# Patient Record
Sex: Male | Born: 1935 | Race: White | Hispanic: No | Marital: Married | Smoking: Former smoker
Health system: Southern US, Community
[De-identification: ages and names within clinical notes are randomized; demographics above are authoritative.]

## PROBLEM LIST (undated history)

## (undated) DIAGNOSIS — E119 Type 2 diabetes mellitus without complications: Secondary | ICD-10-CM

## (undated) DIAGNOSIS — Z9981 Dependence on supplemental oxygen: Secondary | ICD-10-CM

## (undated) DIAGNOSIS — IMO0001 Reserved for inherently not codable concepts without codable children: Secondary | ICD-10-CM

## (undated) DIAGNOSIS — E039 Hypothyroidism, unspecified: Secondary | ICD-10-CM

## (undated) DIAGNOSIS — Z955 Presence of coronary angioplasty implant and graft: Secondary | ICD-10-CM

## (undated) DIAGNOSIS — E785 Hyperlipidemia, unspecified: Secondary | ICD-10-CM

## (undated) DIAGNOSIS — Z794 Long term (current) use of insulin: Secondary | ICD-10-CM

## (undated) DIAGNOSIS — Z951 Presence of aortocoronary bypass graft: Secondary | ICD-10-CM

## (undated) DIAGNOSIS — I472 Ventricular tachycardia, unspecified: Secondary | ICD-10-CM

## (undated) DIAGNOSIS — I2510 Atherosclerotic heart disease of native coronary artery without angina pectoris: Secondary | ICD-10-CM

## (undated) DIAGNOSIS — I255 Ischemic cardiomyopathy: Secondary | ICD-10-CM

## (undated) DIAGNOSIS — I480 Paroxysmal atrial fibrillation: Secondary | ICD-10-CM

## (undated) DIAGNOSIS — R0602 Shortness of breath: Secondary | ICD-10-CM

## (undated) DIAGNOSIS — I209 Angina pectoris, unspecified: Secondary | ICD-10-CM

## (undated) DIAGNOSIS — Z85118 Personal history of other malignant neoplasm of bronchus and lung: Secondary | ICD-10-CM

## (undated) DIAGNOSIS — I5020 Unspecified systolic (congestive) heart failure: Secondary | ICD-10-CM

## (undated) DIAGNOSIS — F0390 Unspecified dementia without behavioral disturbance: Secondary | ICD-10-CM

## (undated) DIAGNOSIS — I214 Non-ST elevation (NSTEMI) myocardial infarction: Secondary | ICD-10-CM

## (undated) DIAGNOSIS — J8410 Pulmonary fibrosis, unspecified: Secondary | ICD-10-CM

## (undated) DIAGNOSIS — I739 Peripheral vascular disease, unspecified: Secondary | ICD-10-CM

## (undated) HISTORY — DX: Long term (current) use of insulin: Z79.4

## (undated) HISTORY — DX: Non-ST elevation (NSTEMI) myocardial infarction: I21.4

## (undated) HISTORY — DX: Paroxysmal atrial fibrillation: I48.0

## (undated) HISTORY — DX: Presence of coronary angioplasty implant and graft: Z95.5

## (undated) HISTORY — DX: Dependence on supplemental oxygen: Z99.81

## (undated) HISTORY — DX: Personal history of other malignant neoplasm of bronchus and lung: Z85.118

## (undated) HISTORY — DX: Ventricular tachycardia, unspecified: I47.20

## (undated) HISTORY — DX: Presence of aortocoronary bypass graft: Z95.1

## (undated) HISTORY — PX: LOBECTOMY: SHX5089

## (undated) HISTORY — PX: OTHER SURGICAL HISTORY: SHX169

## (undated) HISTORY — DX: Ischemic cardiomyopathy: I25.5

## (undated) HISTORY — DX: Unspecified dementia, unspecified severity, without behavioral disturbance, psychotic disturbance, mood disturbance, and anxiety: F03.90

## (undated) HISTORY — DX: Hyperlipidemia, unspecified: E78.5

## (undated) HISTORY — DX: Reserved for inherently not codable concepts without codable children: IMO0001

## (undated) HISTORY — DX: Ventricular tachycardia: I47.2

## (undated) HISTORY — DX: Unspecified systolic (congestive) heart failure: I50.20

## (undated) HISTORY — DX: Hypothyroidism, unspecified: E03.9

## (undated) HISTORY — DX: Peripheral vascular disease, unspecified: I73.9

## (undated) HISTORY — DX: Pulmonary fibrosis, unspecified: J84.10

## (undated) HISTORY — DX: Atherosclerotic heart disease of native coronary artery without angina pectoris: I25.10

## (undated) HISTORY — DX: Type 2 diabetes mellitus without complications: E11.9

---

## 1994-09-11 HISTORY — PX: CORONARY ARTERY BYPASS GRAFT: SHX141

## 1997-12-31 ENCOUNTER — Other Ambulatory Visit: Admission: RE | Admit: 1997-12-31 | Discharge: 1997-12-31 | Payer: Self-pay | Admitting: Cardiology

## 1998-02-12 ENCOUNTER — Inpatient Hospital Stay (HOSPITAL_COMMUNITY): Admission: AD | Admit: 1998-02-12 | Discharge: 1998-02-19 | Payer: Self-pay | Admitting: Cardiology

## 1998-11-19 ENCOUNTER — Inpatient Hospital Stay (HOSPITAL_COMMUNITY): Admission: RE | Admit: 1998-11-19 | Discharge: 1998-11-29 | Payer: Self-pay | Admitting: Cardiology

## 1998-11-20 ENCOUNTER — Encounter: Payer: Self-pay | Admitting: Cardiology

## 2004-09-11 DIAGNOSIS — Z85118 Personal history of other malignant neoplasm of bronchus and lung: Secondary | ICD-10-CM

## 2004-09-11 HISTORY — DX: Personal history of other malignant neoplasm of bronchus and lung: Z85.118

## 2005-01-26 ENCOUNTER — Encounter: Admission: RE | Admit: 2005-01-26 | Discharge: 2005-01-26 | Payer: Self-pay | Admitting: Cardiology

## 2005-02-03 ENCOUNTER — Ambulatory Visit (HOSPITAL_COMMUNITY): Admission: RE | Admit: 2005-02-03 | Discharge: 2005-02-03 | Payer: Self-pay | Admitting: Thoracic Surgery

## 2005-02-08 ENCOUNTER — Encounter (INDEPENDENT_AMBULATORY_CARE_PROVIDER_SITE_OTHER): Payer: Self-pay | Admitting: *Deleted

## 2005-02-08 ENCOUNTER — Ambulatory Visit (HOSPITAL_COMMUNITY): Admission: RE | Admit: 2005-02-08 | Discharge: 2005-02-08 | Payer: Self-pay | Admitting: Thoracic Surgery

## 2005-02-09 ENCOUNTER — Ambulatory Visit (HOSPITAL_COMMUNITY): Admission: RE | Admit: 2005-02-09 | Discharge: 2005-02-09 | Payer: Self-pay | Admitting: Thoracic Surgery

## 2005-02-20 ENCOUNTER — Encounter (INDEPENDENT_AMBULATORY_CARE_PROVIDER_SITE_OTHER): Payer: Self-pay | Admitting: *Deleted

## 2005-02-20 ENCOUNTER — Inpatient Hospital Stay (HOSPITAL_COMMUNITY): Admission: RE | Admit: 2005-02-20 | Discharge: 2005-02-28 | Payer: Self-pay | Admitting: Cardiothoracic Surgery

## 2005-02-22 ENCOUNTER — Ambulatory Visit: Payer: Self-pay | Admitting: Pulmonary Disease

## 2005-03-16 ENCOUNTER — Encounter: Admission: RE | Admit: 2005-03-16 | Discharge: 2005-03-16 | Payer: Self-pay | Admitting: Thoracic Surgery

## 2005-06-22 ENCOUNTER — Encounter: Admission: RE | Admit: 2005-06-22 | Discharge: 2005-06-22 | Payer: Self-pay | Admitting: Cardiothoracic Surgery

## 2005-09-21 ENCOUNTER — Encounter: Admission: RE | Admit: 2005-09-21 | Discharge: 2005-09-21 | Payer: Self-pay | Admitting: Cardiothoracic Surgery

## 2006-01-04 ENCOUNTER — Encounter: Admission: RE | Admit: 2006-01-04 | Discharge: 2006-01-04 | Payer: Self-pay | Admitting: Cardiothoracic Surgery

## 2006-04-16 IMAGING — CR DG CHEST 1V PORT
1 series · 1 of 1 positions shown · non-contrast
Comparison: none

CLINICAL DATA: Follow-up chest tube removal.
 PORTABLE CHEST - 1 VIEW - 02/25/05 AT 2858 HOURS:
 The left chest tube has been removed.  There is a less than 5 percent left pneumothorax.  The central catheter remains at the SVC-right atrial junction.

[view not recorded]
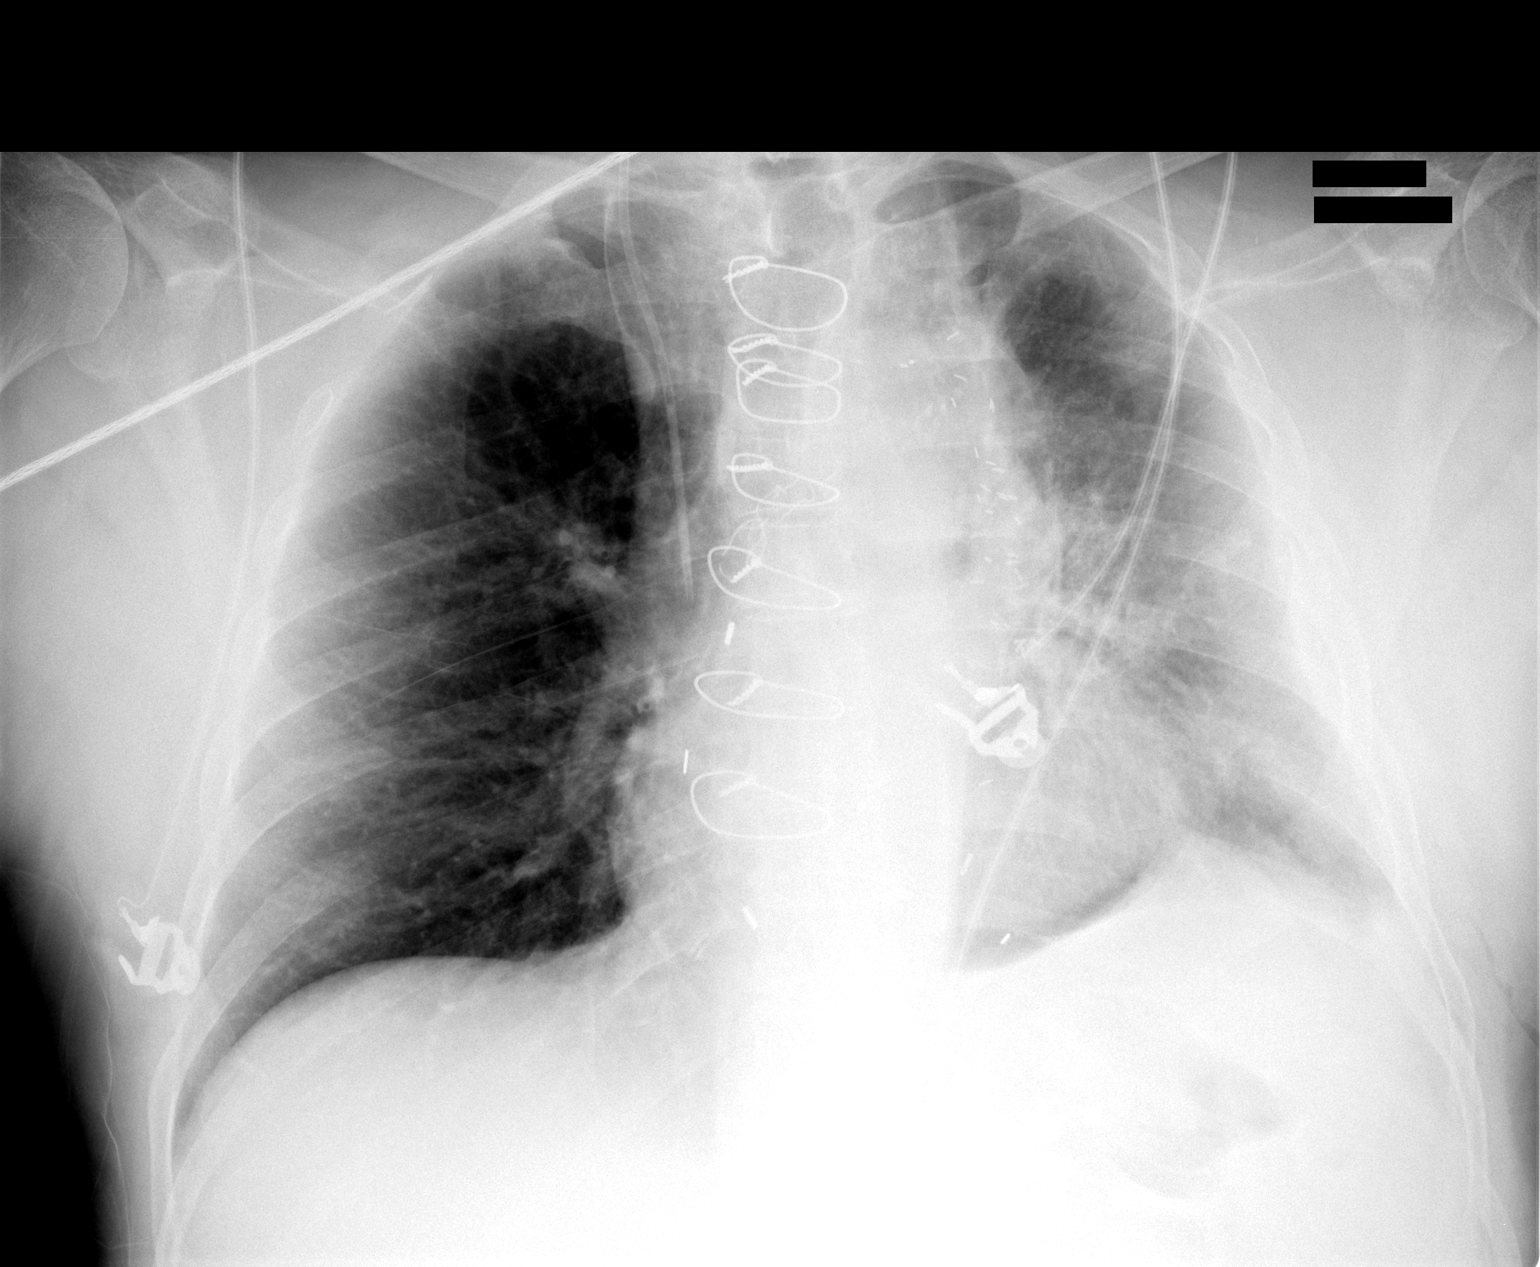

[1 of 1 positions shown; findings below may reference images not displayed]

IMPRESSION: Less than 5 percent pneumothorax status post chest tube removal.

## 2006-04-17 IMAGING — CR DG CHEST 2V
2 series · 2 of 2 positions shown · non-contrast
Comparison: 02/25/05.

CLINICAL DATA: Lung lesion.
 CHEST - 2 VIEW: 
 PA and lateral chest - 02/26/05.

[w chest pa]
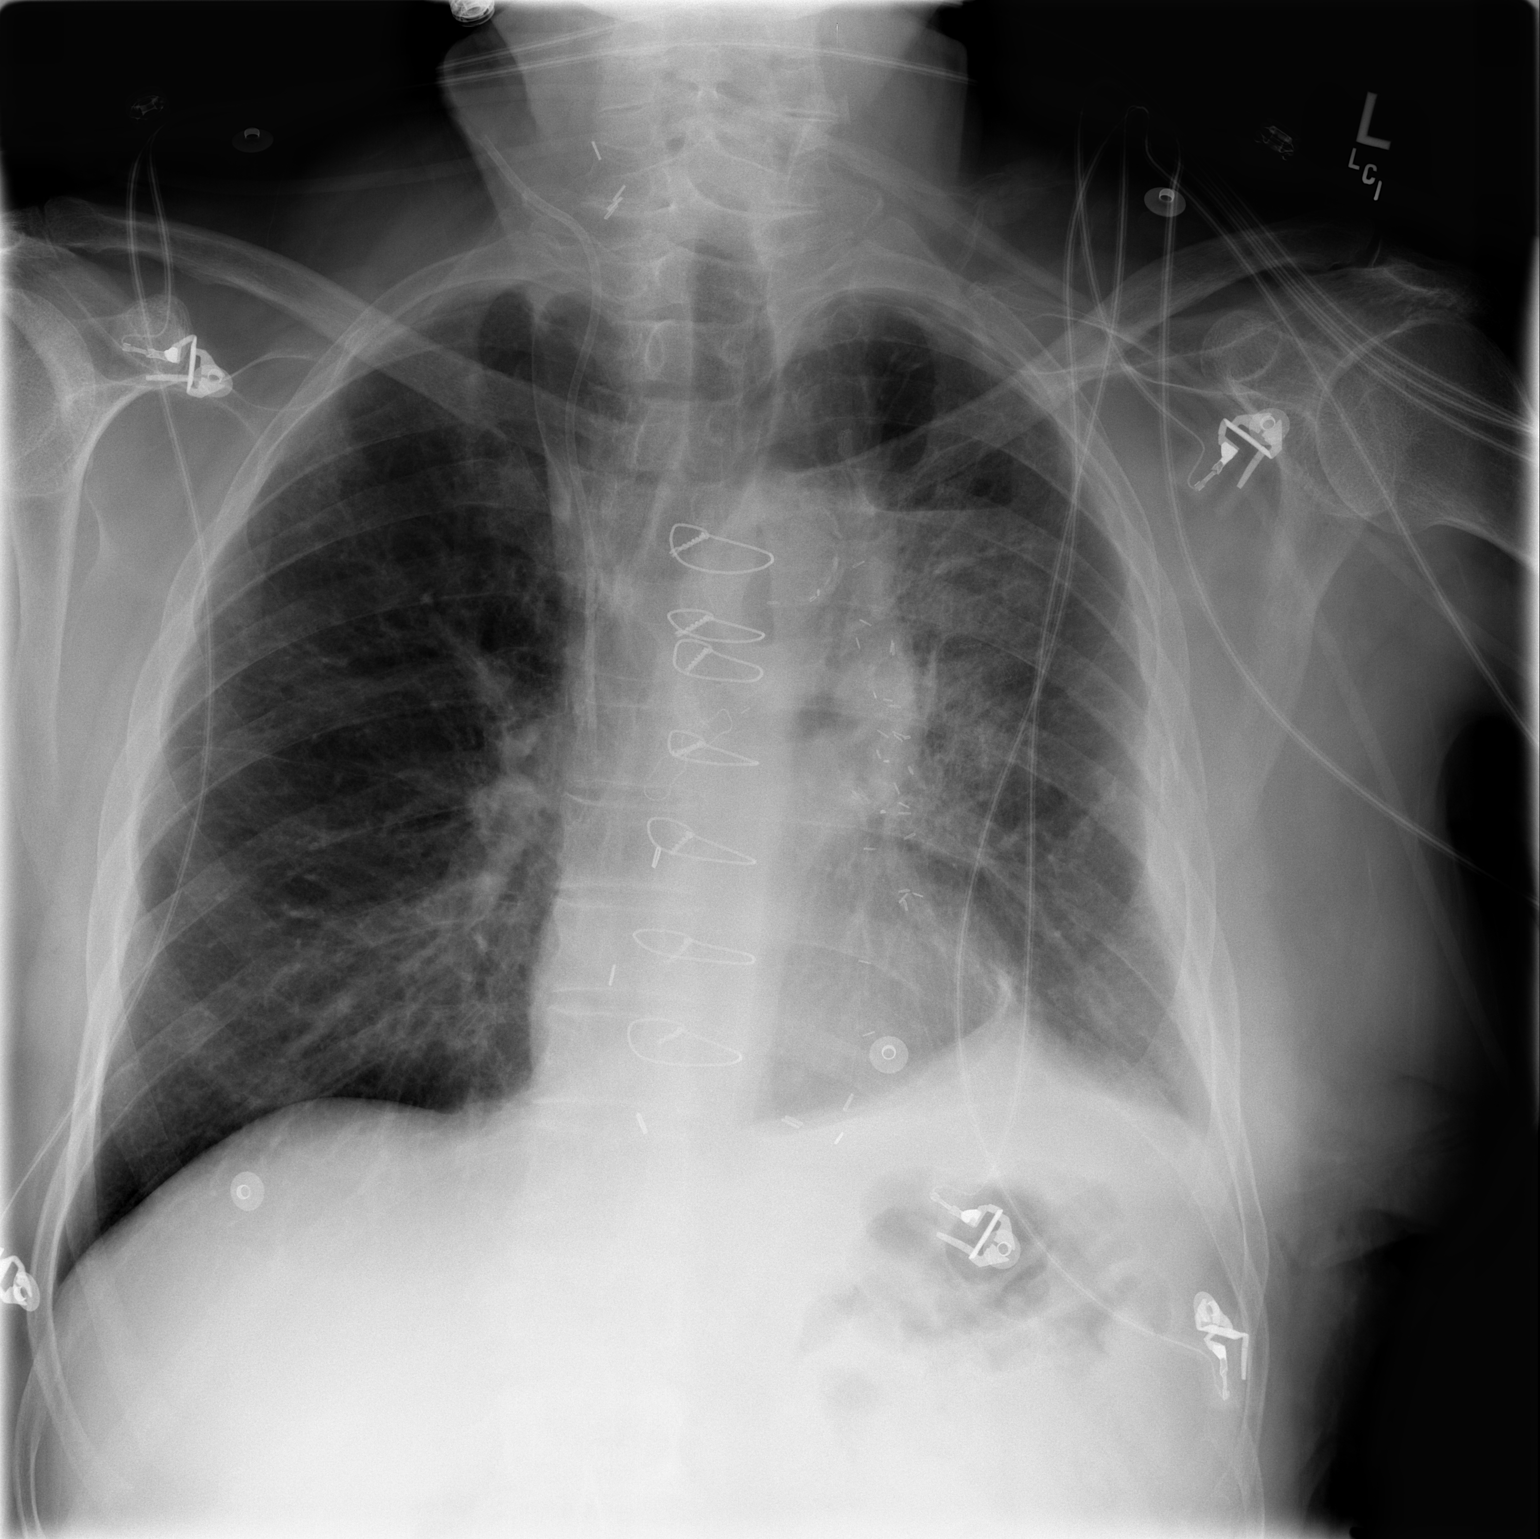

[w chest lat]
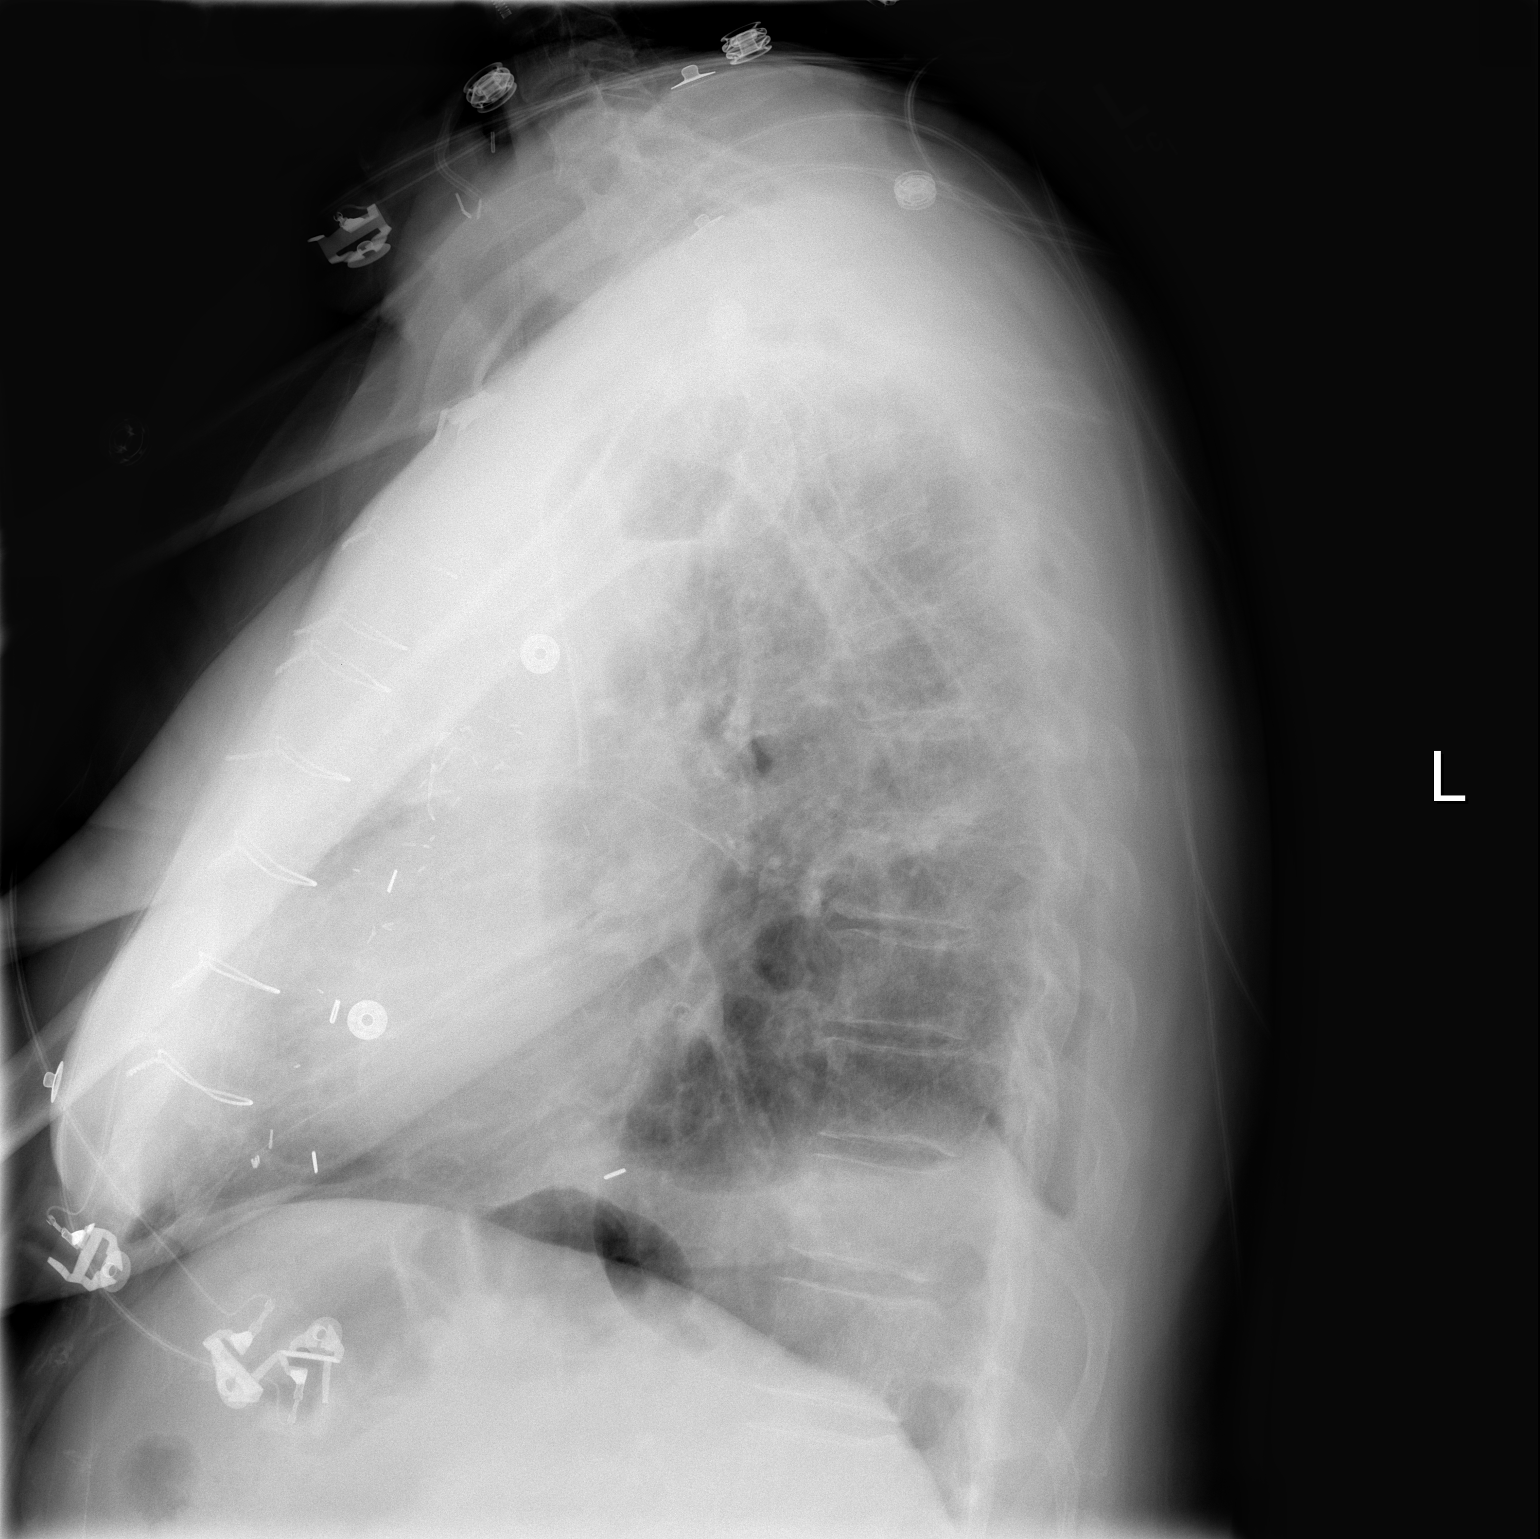

[2 of 2 positions shown; findings below may reference images not displayed]

FINDINGS: The patient has a new, small, left pneumothorax.  There is volume loss in the left chest with tenting of the left hemidiaphragm.  Multiple clips are present in the left hilum.  The right lung remains clear.  A right IJ catheter is in place.
IMPRESSION: Small left pneumothorax without other marked interval change.

## 2006-04-19 IMAGING — CR DG CHEST 2V
2 series · 2 of 2 positions shown · non-contrast
Comparison: none

CLINICAL DATA: Lung lesion status post VATS

[view not recorded (1 of 2)]
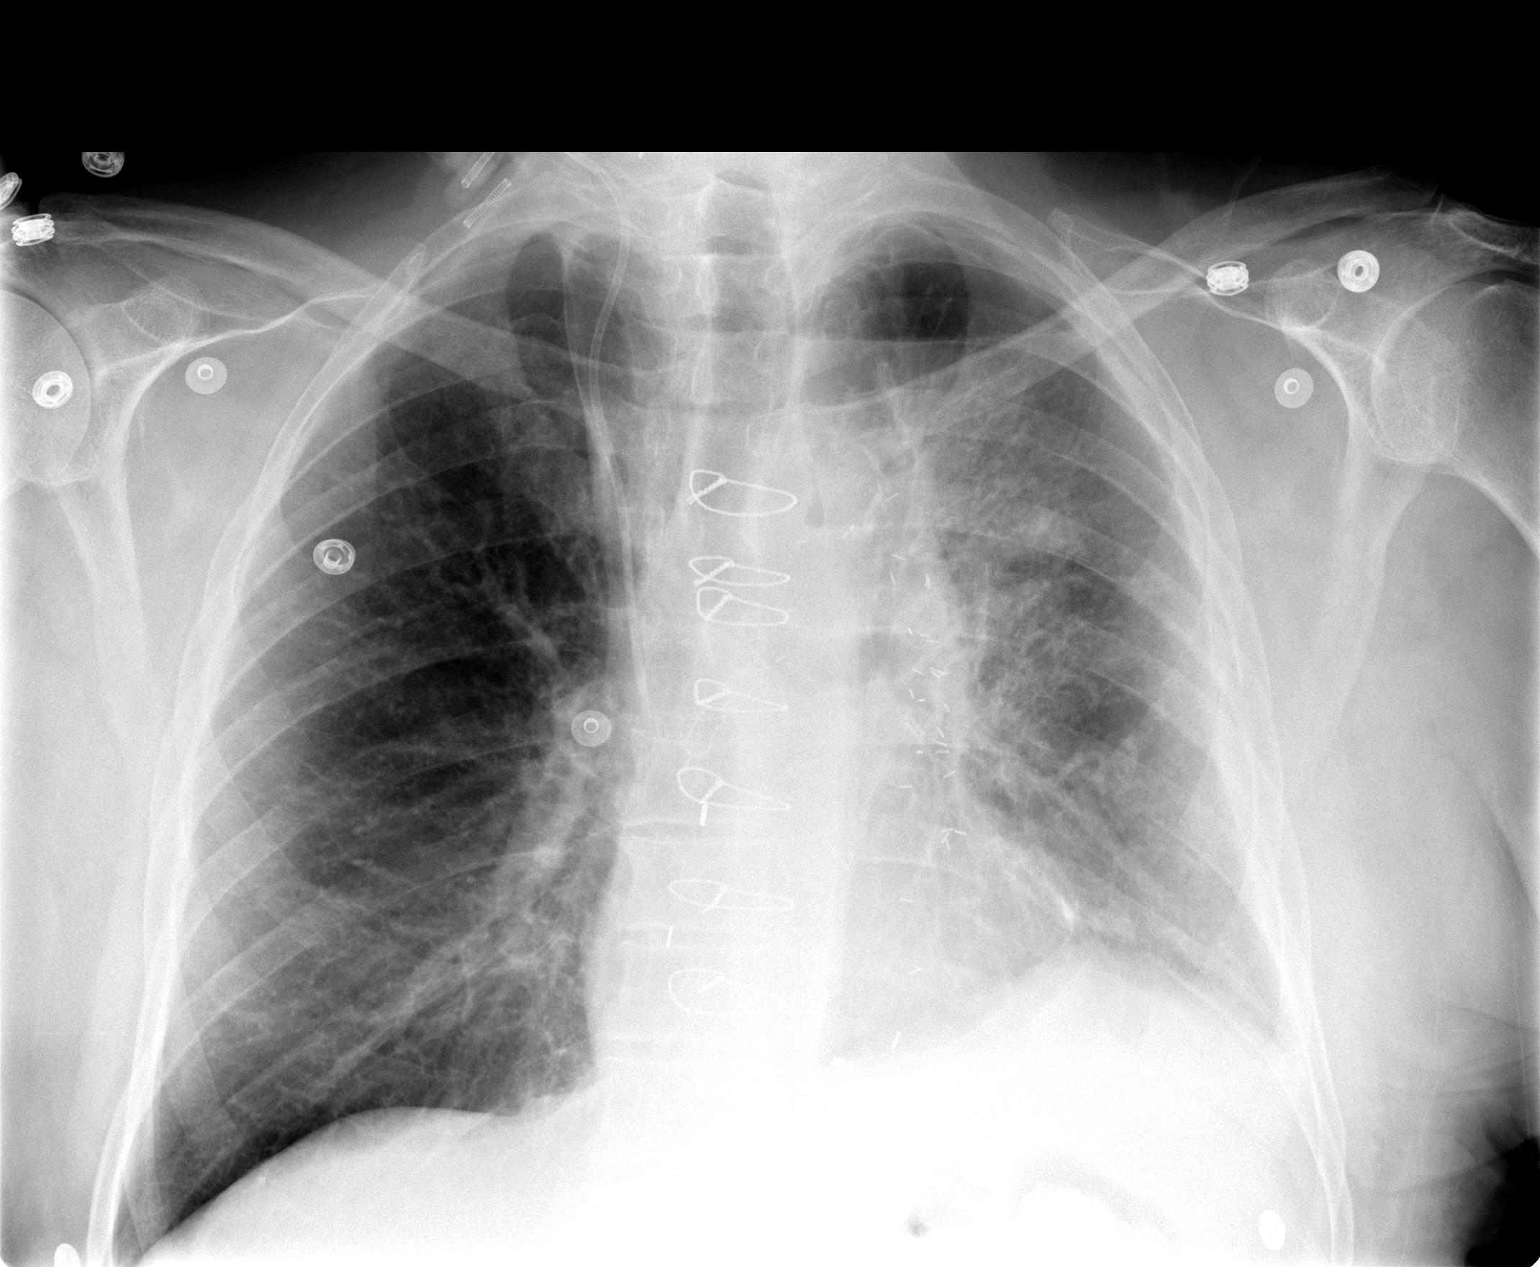

[view not recorded (2 of 2)]
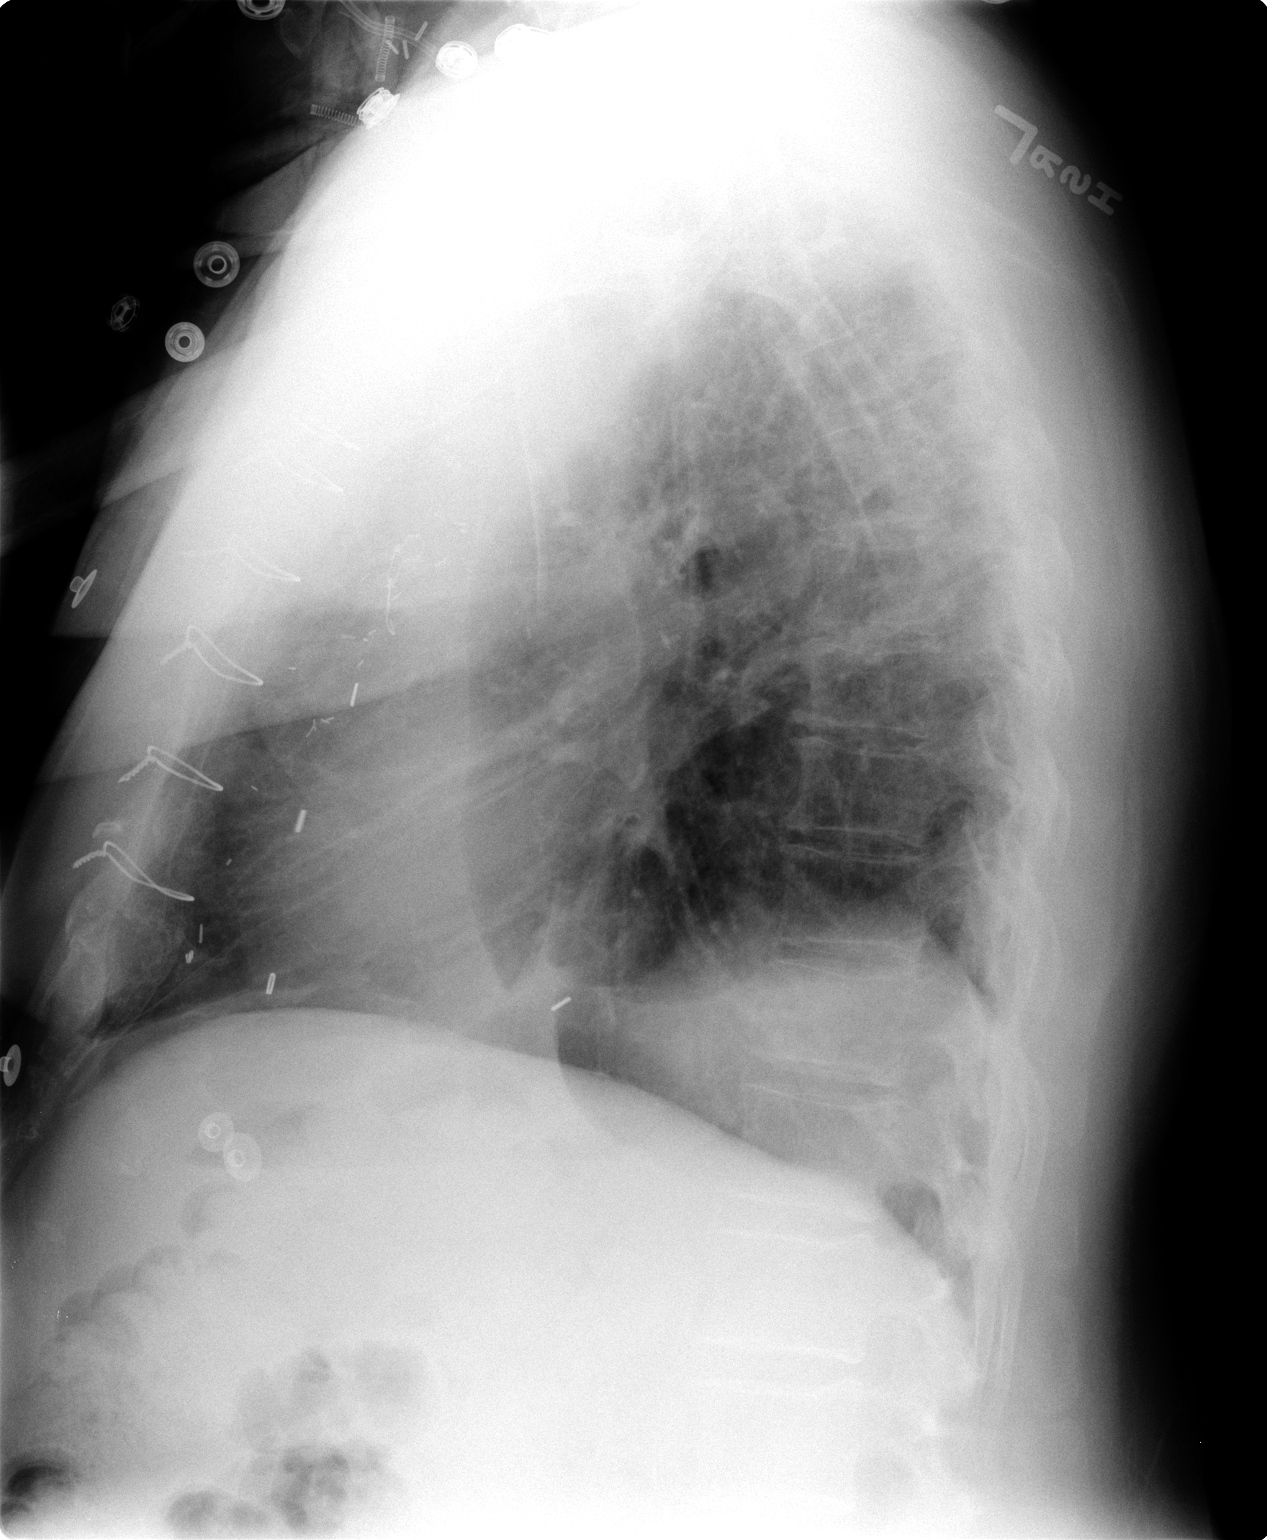

[2 of 2 positions shown; findings below may reference images not displayed]

Portable chest at 640:

Comparison 02/26/2005. Small apical left pneumothorax less than 5% persists.
Coarse   left hilar and left lower lung interstitial opacities persist. Right
lung remains clear. No definite effusion. Heart size upper limits normal.
Changes of CABG. Right IJ central line to low SVC as before.
IMPRESSION: 1. Stable small left apical pneumothorax since 02/26/2005.

## 2006-05-10 ENCOUNTER — Encounter: Admission: RE | Admit: 2006-05-10 | Discharge: 2006-05-10 | Payer: Self-pay | Admitting: Cardiothoracic Surgery

## 2006-11-08 ENCOUNTER — Ambulatory Visit: Payer: Self-pay | Admitting: Cardiothoracic Surgery

## 2006-11-08 ENCOUNTER — Encounter: Admission: RE | Admit: 2006-11-08 | Discharge: 2006-11-08 | Payer: Self-pay | Admitting: Cardiothoracic Surgery

## 2007-05-23 ENCOUNTER — Ambulatory Visit: Payer: Self-pay | Admitting: Cardiothoracic Surgery

## 2007-05-23 ENCOUNTER — Encounter: Admission: RE | Admit: 2007-05-23 | Discharge: 2007-05-23 | Payer: Self-pay | Admitting: Cardiothoracic Surgery

## 2007-08-27 ENCOUNTER — Ambulatory Visit (HOSPITAL_COMMUNITY): Admission: RE | Admit: 2007-08-27 | Discharge: 2007-08-27 | Payer: Self-pay | Admitting: Cardiology

## 2007-11-21 ENCOUNTER — Encounter: Admission: RE | Admit: 2007-11-21 | Discharge: 2007-11-21 | Payer: Self-pay | Admitting: Cardiothoracic Surgery

## 2007-11-21 ENCOUNTER — Ambulatory Visit: Payer: Self-pay | Admitting: Cardiothoracic Surgery

## 2007-12-02 ENCOUNTER — Encounter: Admission: RE | Admit: 2007-12-02 | Discharge: 2007-12-02 | Payer: Self-pay | Admitting: Cardiology

## 2007-12-23 ENCOUNTER — Encounter: Payer: Self-pay | Admitting: Pulmonary Disease

## 2007-12-23 DIAGNOSIS — R0602 Shortness of breath: Secondary | ICD-10-CM | POA: Insufficient documentation

## 2008-02-21 ENCOUNTER — Encounter: Admission: RE | Admit: 2008-02-21 | Discharge: 2008-02-21 | Payer: Self-pay | Admitting: Cardiology

## 2008-05-28 ENCOUNTER — Ambulatory Visit: Payer: Self-pay | Admitting: Cardiothoracic Surgery

## 2008-05-28 ENCOUNTER — Encounter: Admission: RE | Admit: 2008-05-28 | Discharge: 2008-05-28 | Payer: Self-pay | Admitting: Cardiothoracic Surgery

## 2008-12-08 ENCOUNTER — Encounter: Admission: RE | Admit: 2008-12-08 | Discharge: 2008-12-08 | Payer: Self-pay | Admitting: Cardiothoracic Surgery

## 2008-12-08 ENCOUNTER — Ambulatory Visit: Payer: Self-pay | Admitting: Cardiothoracic Surgery

## 2009-06-10 ENCOUNTER — Encounter: Admission: RE | Admit: 2009-06-10 | Discharge: 2009-06-10 | Payer: Self-pay | Admitting: Cardiothoracic Surgery

## 2009-06-10 ENCOUNTER — Ambulatory Visit: Payer: Self-pay | Admitting: Cardiothoracic Surgery

## 2009-12-09 ENCOUNTER — Encounter: Admission: RE | Admit: 2009-12-09 | Discharge: 2009-12-09 | Payer: Self-pay | Admitting: Cardiothoracic Surgery

## 2009-12-09 ENCOUNTER — Ambulatory Visit: Payer: Self-pay | Admitting: Cardiothoracic Surgery

## 2010-04-04 ENCOUNTER — Emergency Department (HOSPITAL_COMMUNITY): Admission: EM | Admit: 2010-04-04 | Discharge: 2010-04-04 | Payer: Self-pay | Admitting: Emergency Medicine

## 2010-04-13 ENCOUNTER — Ambulatory Visit: Payer: Self-pay | Admitting: Cardiology

## 2010-04-13 ENCOUNTER — Ambulatory Visit (HOSPITAL_COMMUNITY): Admission: RE | Admit: 2010-04-13 | Discharge: 2010-04-13 | Payer: Self-pay | Admitting: Cardiology

## 2010-05-17 ENCOUNTER — Ambulatory Visit: Payer: Self-pay | Admitting: Cardiology

## 2010-07-21 ENCOUNTER — Ambulatory Visit: Payer: Self-pay | Admitting: Cardiology

## 2010-07-29 ENCOUNTER — Ambulatory Visit: Payer: Self-pay | Admitting: Cardiology

## 2010-09-11 DIAGNOSIS — I214 Non-ST elevation (NSTEMI) myocardial infarction: Secondary | ICD-10-CM

## 2010-09-11 HISTORY — DX: Non-ST elevation (NSTEMI) myocardial infarction: I21.4

## 2010-09-20 ENCOUNTER — Ambulatory Visit: Payer: Self-pay | Admitting: Cardiology

## 2010-10-01 ENCOUNTER — Encounter: Payer: Self-pay | Admitting: Cardiothoracic Surgery

## 2010-11-10 DIAGNOSIS — Z955 Presence of coronary angioplasty implant and graft: Secondary | ICD-10-CM

## 2010-11-10 HISTORY — PX: OTHER SURGICAL HISTORY: SHX169

## 2010-11-10 HISTORY — DX: Presence of coronary angioplasty implant and graft: Z95.5

## 2010-11-10 HISTORY — PX: CARDIAC CATHETERIZATION: SHX172

## 2010-11-24 DIAGNOSIS — R0602 Shortness of breath: Secondary | ICD-10-CM

## 2010-11-24 DIAGNOSIS — I5033 Acute on chronic diastolic (congestive) heart failure: Secondary | ICD-10-CM

## 2010-11-25 ENCOUNTER — Encounter: Payer: Self-pay | Admitting: Cardiology

## 2010-11-25 DIAGNOSIS — I200 Unstable angina: Secondary | ICD-10-CM

## 2010-11-26 LAB — POCT I-STAT, CHEM 8
BUN: 17 mg/dL (ref 6–23)
Calcium, Ion: 1.09 mmol/L — ABNORMAL LOW (ref 1.12–1.32)
Chloride: 100 mEq/L (ref 96–112)
Creatinine, Ser: 0.9 mg/dL (ref 0.4–1.5)
Glucose, Bld: 183 mg/dL — ABNORMAL HIGH (ref 70–99)
HCT: 39 % (ref 39.0–52.0)
Potassium: 5.4 mEq/L — ABNORMAL HIGH (ref 3.5–5.1)

## 2010-11-26 LAB — CBC
HCT: 37 % — ABNORMAL LOW (ref 39.0–52.0)
Hemoglobin: 12.1 g/dL — ABNORMAL LOW (ref 13.0–17.0)
RBC: 4.79 MIL/uL (ref 4.22–5.81)
WBC: 10.5 10*3/uL (ref 4.0–10.5)

## 2010-11-26 LAB — DIFFERENTIAL
Basophils Relative: 0 % (ref 0–1)
Lymphocytes Relative: 11 % — ABNORMAL LOW (ref 12–46)
Lymphs Abs: 1.2 10*3/uL (ref 0.7–4.0)
Monocytes Absolute: 1 10*3/uL (ref 0.1–1.0)
Monocytes Relative: 10 % (ref 3–12)
Neutro Abs: 8 10*3/uL — ABNORMAL HIGH (ref 1.7–7.7)
Neutrophils Relative %: 77 % (ref 43–77)

## 2010-11-26 LAB — POCT CARDIAC MARKERS
CKMB, poc: 1 ng/mL (ref 1.0–8.0)
Troponin i, poc: 0.08 ng/mL (ref 0.00–0.09)

## 2010-11-26 LAB — BRAIN NATRIURETIC PEPTIDE: Pro B Natriuretic peptide (BNP): 200 pg/mL — ABNORMAL HIGH (ref 0.0–100.0)

## 2010-11-29 NOTE — Consult Note (Signed)
Summary: [facility] PROGRESS NOTE  [facility] PROGRESS NOTE   Imported By: Zachary George 11/25/2010 11:07:56  _____________________________________________________________________  External Attachment:    Type:   Image     Comment:   External Document

## 2010-12-01 ENCOUNTER — Encounter: Payer: Self-pay | Admitting: Cardiology

## 2010-12-01 ENCOUNTER — Encounter: Payer: Self-pay | Admitting: *Deleted

## 2010-12-01 ENCOUNTER — Ambulatory Visit (INDEPENDENT_AMBULATORY_CARE_PROVIDER_SITE_OTHER): Payer: 59 | Admitting: *Deleted

## 2010-12-01 DIAGNOSIS — R0602 Shortness of breath: Secondary | ICD-10-CM

## 2010-12-01 DIAGNOSIS — I2510 Atherosclerotic heart disease of native coronary artery without angina pectoris: Secondary | ICD-10-CM

## 2010-12-01 NOTE — Patient Instructions (Signed)
Do labs today - BMET Follow up in  4-6 weeks.

## 2010-12-04 DIAGNOSIS — I509 Heart failure, unspecified: Secondary | ICD-10-CM

## 2010-12-04 DIAGNOSIS — R0602 Shortness of breath: Secondary | ICD-10-CM

## 2010-12-05 ENCOUNTER — Inpatient Hospital Stay (HOSPITAL_COMMUNITY)
Admission: AD | Admit: 2010-12-05 | Discharge: 2010-12-08 | DRG: 247 | Disposition: A | Discharge status: Home / Self Care | Payer: Medicare Other | Source: Other Acute Inpatient Hospital | Attending: Cardiology | Admitting: Cardiology

## 2010-12-05 DIAGNOSIS — Z7982 Long term (current) use of aspirin: Secondary | ICD-10-CM

## 2010-12-05 DIAGNOSIS — I2589 Other forms of chronic ischemic heart disease: Secondary | ICD-10-CM | POA: Diagnosis present

## 2010-12-05 DIAGNOSIS — J8410 Pulmonary fibrosis, unspecified: Secondary | ICD-10-CM | POA: Diagnosis present

## 2010-12-05 DIAGNOSIS — Z951 Presence of aortocoronary bypass graft: Secondary | ICD-10-CM

## 2010-12-05 DIAGNOSIS — R0602 Shortness of breath: Secondary | ICD-10-CM

## 2010-12-05 DIAGNOSIS — I509 Heart failure, unspecified: Secondary | ICD-10-CM | POA: Diagnosis present

## 2010-12-05 DIAGNOSIS — I252 Old myocardial infarction: Secondary | ICD-10-CM

## 2010-12-05 DIAGNOSIS — I959 Hypotension, unspecified: Secondary | ICD-10-CM | POA: Diagnosis present

## 2010-12-05 DIAGNOSIS — C3410 Malignant neoplasm of upper lobe, unspecified bronchus or lung: Secondary | ICD-10-CM

## 2010-12-05 DIAGNOSIS — Z7902 Long term (current) use of antithrombotics/antiplatelets: Secondary | ICD-10-CM

## 2010-12-05 DIAGNOSIS — I700 Atherosclerosis of aorta: Secondary | ICD-10-CM | POA: Diagnosis present

## 2010-12-05 DIAGNOSIS — E039 Hypothyroidism, unspecified: Secondary | ICD-10-CM | POA: Diagnosis present

## 2010-12-05 DIAGNOSIS — F068 Other specified mental disorders due to known physiological condition: Secondary | ICD-10-CM | POA: Diagnosis present

## 2010-12-05 DIAGNOSIS — I5023 Acute on chronic systolic (congestive) heart failure: Principal | ICD-10-CM | POA: Diagnosis present

## 2010-12-05 DIAGNOSIS — I2510 Atherosclerotic heart disease of native coronary artery without angina pectoris: Secondary | ICD-10-CM | POA: Diagnosis present

## 2010-12-05 DIAGNOSIS — I10 Essential (primary) hypertension: Secondary | ICD-10-CM | POA: Diagnosis present

## 2010-12-05 DIAGNOSIS — D509 Iron deficiency anemia, unspecified: Secondary | ICD-10-CM | POA: Diagnosis present

## 2010-12-05 DIAGNOSIS — Z85118 Personal history of other malignant neoplasm of bronchus and lung: Secondary | ICD-10-CM

## 2010-12-05 DIAGNOSIS — E119 Type 2 diabetes mellitus without complications: Secondary | ICD-10-CM | POA: Diagnosis present

## 2010-12-05 DIAGNOSIS — E785 Hyperlipidemia, unspecified: Secondary | ICD-10-CM | POA: Diagnosis present

## 2010-12-05 LAB — GLUCOSE, CAPILLARY: Glucose-Capillary: 146 mg/dL — ABNORMAL HIGH (ref 70–99)

## 2010-12-06 DIAGNOSIS — I2510 Atherosclerotic heart disease of native coronary artery without angina pectoris: Secondary | ICD-10-CM

## 2010-12-06 LAB — CBC
MCH: 24 pg — ABNORMAL LOW (ref 26.0–34.0)
MCV: 78 fL (ref 78.0–100.0)
Platelets: 168 10*3/uL (ref 150–400)
RDW: 16.5 % — ABNORMAL HIGH (ref 11.5–15.5)
WBC: 7.5 10*3/uL (ref 4.0–10.5)

## 2010-12-06 LAB — BASIC METABOLIC PANEL
BUN: 17 mg/dL (ref 6–23)
Creatinine, Ser: 0.9 mg/dL (ref 0.4–1.5)
GFR calc Af Amer: >60 mL/min (ref >60)
GFR calc non Af Amer: >60 mL/min (ref >60)
Potassium: 4 mEq/L (ref 3.5–5.1)

## 2010-12-06 LAB — GLUCOSE, CAPILLARY
Glucose-Capillary: 128 mg/dL — ABNORMAL HIGH (ref 70–99)
Glucose-Capillary: 114 mg/dL — ABNORMAL HIGH (ref 70–99)

## 2010-12-07 ENCOUNTER — Inpatient Hospital Stay (HOSPITAL_COMMUNITY): Payer: Medicare Other

## 2010-12-07 DIAGNOSIS — I25810 Atherosclerosis of coronary artery bypass graft(s) without angina pectoris: Secondary | ICD-10-CM

## 2010-12-07 LAB — CBC
Hemoglobin: 10.3 g/dL — ABNORMAL LOW (ref 13.0–17.0)
MCH: 24.2 pg — ABNORMAL LOW (ref 26.0–34.0)
MCHC: 30.7 g/dL (ref 30.0–36.0)
MCV: 78.6 fL (ref 78.0–100.0)
RBC: 4.26 MIL/uL (ref 4.22–5.81)

## 2010-12-07 LAB — BASIC METABOLIC PANEL
CO2: 28 mEq/L (ref 19–32)
Calcium: 8.9 mg/dL (ref 8.4–10.5)
Chloride: 102 mEq/L (ref 96–112)
GFR calc Af Amer: >60 mL/min (ref >60)
Sodium: 139 mEq/L (ref 135–145)

## 2010-12-07 LAB — GLUCOSE, CAPILLARY
Glucose-Capillary: 113 mg/dL — ABNORMAL HIGH (ref 70–99)
Glucose-Capillary: 94 mg/dL (ref 70–99)
Glucose-Capillary: 107 mg/dL — ABNORMAL HIGH (ref 70–99)
Glucose-Capillary: 114 mg/dL — ABNORMAL HIGH (ref 70–99)

## 2010-12-07 LAB — APTT: aPTT: 29 seconds (ref 24–37)

## 2010-12-07 NOTE — Procedures (Signed)
  NAME:  [name] [name], [name] [name] NO.:  [id number]  MEDICAL RECORD NO.:  [id number]           PATIENT TYPE:  I  LOCATION:  3730                         FACILITY:  [facility]  PHYSICIAN:  Noralyn Pick. Eden Emms, MD, FACCDATE OF BIRTH:  Dec 01, 1924  DATE OF PROCEDURE: DATE OF DISCHARGE:                           CARDIAC CATHETERIZATION   Coronary arteriography.  INDICATIONS:  Chest pain, shortness of breath, ischemic cardiomyopathy.  Cine catheterization was done from the right femoral artery.  The patient has significant dementia.  He had significant cramping of the left arm and left leg.  During the case, he was fairly uncomfortable. He was treated with morphine.  Left main coronary artery had a 50% distal stenosis.  The left anterior descending artery was occluded in the midvessel.  The first diagonal branch had 40-50% multiple discrete lesions.  There was a large septal perforator.  Circumflex coronary artery was subtotally occluded with diffuse disease in the first obtuse marginal branch.  The right coronary artery was 100% occluded in the midvessel.  The left internal mammary artery was widely patent to the LAD.  The distal LAD had 50% tubular disease.  There is significant disease in the left subclavian artery.  We required a LIMA catheter and a Wholey wire as well as a long exchange to get across the subclavian.  The saphenous vein graft to the PDA was widely patent.  The saphenous vein graft to the obtuse marginal branch had a 90% ostial stenosis.  Due to the amount of dye used to find the patient's bypass grafts, we elected to discross the valve for pressures and did not shoot an left ventriculogram.  Aortic pressure was 138/52, subclavian pressure was 168/66, LV pressure was 167/13.  IMPRESSION:  The patient has ischemic cardiomyopathy.  There is about of 30-mm gradient across subclavian but I do not think the flow down the left internal mammary artery or the  distal LAD is significant enough to intervene on, however, the ostium of the vein graft to the obtuse marginal system is very tight.  The patient was significantly uncomfortable on the table and we had artery used a bit of dye.  I discussed with Dr. Excell Seltzer and we both felt the best thing to do was to end the diagnostic case and Dr. Excell Seltzer will review his films.  He will consider bringing him back in 24-36 hours to stent the ostium, the vein graft to the obtuse marginal system.     Noralyn Pick. Eden Emms, MD, [facility]     PCN/MEDQ  D:  12/06/2010  T:  12/07/2010  Job:  [id number]  cc:   Learta Codding, MD,FACC  Electronically Signed by Charlton Haws MD [facility] on 12/07/2010 11:11:14 PM

## 2010-12-08 ENCOUNTER — Ambulatory Visit: Payer: Self-pay | Admitting: Cardiothoracic Surgery

## 2010-12-08 DIAGNOSIS — I5023 Acute on chronic systolic (congestive) heart failure: Secondary | ICD-10-CM

## 2010-12-08 LAB — CBC
Hemoglobin: 9.7 g/dL — ABNORMAL LOW (ref 13.0–17.0)
MCH: 24.6 pg — ABNORMAL LOW (ref 26.0–34.0)
MCHC: 31.1 g/dL (ref 30.0–36.0)
MCV: 79.2 fL (ref 78.0–100.0)
RBC: 3.94 MIL/uL — ABNORMAL LOW (ref 4.22–5.81)

## 2010-12-08 LAB — BASIC METABOLIC PANEL
BUN: 15 mg/dL (ref 6–23)
CO2: 30 mEq/L (ref 19–32)
Calcium: 8.9 mg/dL (ref 8.4–10.5)
Chloride: 102 mEq/L (ref 96–112)
Creatinine, Ser: 0.98 mg/dL (ref 0.4–1.5)
GFR calc Af Amer: >60 mL/min (ref >60)
Glucose, Bld: 106 mg/dL — ABNORMAL HIGH (ref 70–99)

## 2010-12-08 LAB — GLUCOSE, CAPILLARY: Glucose-Capillary: 113 mg/dL — ABNORMAL HIGH (ref 70–99)

## 2010-12-12 NOTE — Procedures (Signed)
NAME:  [name] [name], [name] [name] NO.:  [id number]  MEDICAL RECORD NO.:  [id number]           PATIENT TYPE:  I  LOCATION:  6529                         FACILITY:  [facility]  PHYSICIAN:  Veverly Fells. Excell Seltzer, MD  DATE OF BIRTH:  1924-11-04  DATE OF PROCEDURE: DATE OF DISCHARGE:                           CARDIAC CATHETERIZATION   PROCEDURE:  PTCA and stenting of the saphenous vein graft, obtuse marginal.  PROCEDURAL INDICATIONS:  Mr. Biehl is an 77 year old gentleman with coronary artery disease status post remote coronary bypass surgery.  He presented recently to [facility] with a non-ST-elevation infarction and he was managed medically.  He came back in this admission with congestive heart failure and was referred for cardiac catheterization because of concern about obstructive CAD and myocardial ischemia as a cause of his heart failure.  The patient underwent diagnostic catheterization by Dr. Eden Emms yesterday.  This demonstrated critical stenosis of the ostium of the saphenous vein graft to obtuse marginal branch.  There was patency of the mammary artery to LAD and saphenous vein graft to PDA.  The lesion in the vein graft was approximately 90% stenosed.  After reviewing the films, I referred the patient for PCI.  Risks and indications of procedure were reviewed with the patient and family.  Informed consent was obtained.  The right groin was prepped and draped, anesthetized with 1% lidocaine using modified Seldinger technique.  A 6-French sheath was placed in the right femoral artery. Angiomax was started for anticoagulation.  The patient has been maintained on aspirin and Plavix and he received 300 mg of Plavix this morning.  A 6-French AL-1 guide catheter with side holes was inserted and initial imaging of the vein graft to obtuse marginal was performed. The vein graft was then wired with a Cougar wire and a 4-mm Spider device was advanced into the distal body  of the graft for distal embolic protection.  I attempted to primarily stent the vein graft with a 3.0 x 15 mm Resolute drug-eluting stent but the stent would not pass.  I then put in a 2.5 x 15 mm balloon and then inflation to 10 atmospheres was performed.  I was then able to advance the Resolute stent across the lesion with full coverage of the ostium.  The stent was deployed at 14 atmospheres.  It did appear to be under deployed at the aortic anastomotic site.  I initially tried to advance a 3.5 x 15 mm [place] Quantum Apex but it would not pass and in fact it pulled back the Spider device, so that it was very near the stent.  I tried several times to advance the Spider device but I was unable.  Guide position became more precarious.  Eventually, I was able to pass a retrieval sheath through the stent, so that it retrieved the embolic protection device.  I was not able to re-engage the vessel and a 5-French LCB diagnostic catheter was inserted and performed selective imaging of the vessel and there was a good stent result but there was 30-40% residual stenosis over the origin of the vein graft.  I felt this needed to  be redilated with a noncompliant balloon.  I did not feel like another embolic protection device was necessary because of the fact that the stent had already been deployed.  A 6-French LCB side-hole guide catheter was inserted.  It was difficult to engage the graft but I was eventually able to get the graft wired with a Cougar guidewire.  A 3.5 x 15 mm [place] Quantum Apex passed pretty easily and it was dilated up to 18 atmospheres over the area of under expansion.  There was improvement in the result with only 10% residual stenosis.  There was TIMI-3 flow in the graft and an excellent final result.  The patient tolerated the procedure well.  The guide catheter was removed and the patient was transferred to recovery area for further care.  FINAL CONCLUSIONS:  Successful stenting of  severe ostial stenosis of the saphenous vein graft to obtuse marginal, reducing 90% stenosis to 0 with TIMI-3 flow both pre and post.  RECOMMENDATIONS:  The patient should remain on dual-antiplatelet therapy with aspirin and Plavix for minimum of 12 months.     Veverly Fells. Excell Seltzer, MD     MDC/MEDQ  D:  12/07/2010  T:  12/08/2010  Job:  [id number]  Electronically Signed by Tonny Bollman MD on 12/12/2010 01:14:30 PM

## 2010-12-19 ENCOUNTER — Encounter: Payer: Self-pay | Admitting: Nurse Practitioner

## 2010-12-21 ENCOUNTER — Encounter: Payer: Self-pay | Admitting: *Deleted

## 2010-12-22 ENCOUNTER — Ambulatory Visit (INDEPENDENT_AMBULATORY_CARE_PROVIDER_SITE_OTHER): Payer: Medicare Other | Admitting: Nurse Practitioner

## 2010-12-22 ENCOUNTER — Encounter: Payer: Self-pay | Admitting: Nurse Practitioner

## 2010-12-22 VITALS — BP 152/42 | HR 84 | Ht 71.0 in | Wt 199.8 lb

## 2010-12-22 DIAGNOSIS — R0600 Dyspnea, unspecified: Secondary | ICD-10-CM

## 2010-12-22 DIAGNOSIS — R0609 Other forms of dyspnea: Secondary | ICD-10-CM

## 2010-12-22 DIAGNOSIS — I214 Non-ST elevation (NSTEMI) myocardial infarction: Secondary | ICD-10-CM

## 2010-12-22 DIAGNOSIS — I5020 Unspecified systolic (congestive) heart failure: Secondary | ICD-10-CM

## 2010-12-22 DIAGNOSIS — I255 Ischemic cardiomyopathy: Secondary | ICD-10-CM | POA: Insufficient documentation

## 2010-12-22 DIAGNOSIS — Z9861 Coronary angioplasty status: Secondary | ICD-10-CM

## 2010-12-22 DIAGNOSIS — R0989 Other specified symptoms and signs involving the circulatory and respiratory systems: Secondary | ICD-10-CM

## 2010-12-22 DIAGNOSIS — I2510 Atherosclerotic heart disease of native coronary artery without angina pectoris: Secondary | ICD-10-CM

## 2010-12-22 DIAGNOSIS — I2589 Other forms of chronic ischemic heart disease: Secondary | ICD-10-CM

## 2010-12-22 DIAGNOSIS — Z955 Presence of coronary angioplasty implant and graft: Secondary | ICD-10-CM

## 2010-12-22 DIAGNOSIS — D649 Anemia, unspecified: Secondary | ICD-10-CM

## 2010-12-22 LAB — BASIC METABOLIC PANEL
BUN: 16 mg/dL (ref 6–23)
CO2: 31 mEq/L (ref 19–32)
Calcium: 9.3 mg/dL (ref 8.4–10.5)
Chloride: 101 mEq/L (ref 96–112)
Creatinine, Ser: 1 mg/dL (ref 0.4–1.5)
GFR: 78.38 mL/min (ref >60.00)
Glucose, Bld: 131 mg/dL — ABNORMAL HIGH (ref 70–99)
Potassium: 5 mEq/L (ref 3.5–5.1)
Sodium: 142 mEq/L (ref 135–145)

## 2010-12-22 LAB — CBC WITH DIFFERENTIAL/PLATELET
Basophils Absolute: 0 10*3/uL (ref 0.0–0.1)
Basophils Relative: 0.4 % (ref 0.0–3.0)
Eosinophils Absolute: 0.3 10*3/uL (ref 0.0–0.7)
Eosinophils Relative: 2.8 % (ref 0.0–5.0)
HCT: 35.3 % — ABNORMAL LOW (ref 39.0–52.0)
Hemoglobin: 11.4 g/dL — ABNORMAL LOW (ref 13.0–17.0)
Lymphocytes Relative: 13.5 % (ref 12.0–46.0)
Lymphs Abs: 1.3 10*3/uL (ref 0.7–4.0)
MCHC: 32.3 g/dL (ref 30.0–36.0)
MCV: 78.3 fl (ref 78.0–100.0)
Monocytes Absolute: 0.6 10*3/uL (ref 0.1–1.0)
Monocytes Relative: 6.4 % (ref 3.0–12.0)
Neutro Abs: 7.1 10*3/uL (ref 1.4–7.7)
Neutrophils Relative %: 76.9 % (ref 43.0–77.0)
Platelets: 212 10*3/uL (ref 150.0–400.0)
RBC: 4.51 Mil/uL (ref 4.22–5.81)
RDW: 18.5 % — ABNORMAL HIGH (ref 11.5–14.6)
WBC: 9.3 10*3/uL (ref 4.5–10.5)

## 2010-12-22 LAB — BRAIN NATRIURETIC PEPTIDE: Pro B Natriuretic peptide (BNP): 579.9 pg/mL — ABNORMAL HIGH (ref 0.0–100.0)

## 2010-12-22 NOTE — Progress Notes (Signed)
[name] [name] [birth year]   History of Present Illness:  [name] [name] is seen today for a post hospital visit. He is seen for Dr. Patty Sermons. He has had a recent CHF exacerbation with positive enzymes. He has had a DES to the SVG to the OM. He has been home about 2 weeks. He is doing ok. His wife thinks his breathing has improved as well as his color. He has had only one short episode of chest pain.She gave him NTG with prompt relief. He has not had any recurrence. He does not weigh on a daily basis. He is on his Plavix. He is not having any significant bruising. He is pleasantly demented. His wife provides most of the history. He was anemic at discharge.   Current Outpatient Prescriptions on File Prior to Visit  Medication Sig Dispense Refill  . aspirin 325 MG tablet Take 325 mg by mouth daily.        . Cinnamon 500 MG capsule Take 500 mg by mouth daily.        [name] donepezil (ARICEPT) 10 MG tablet Take one by mouth daily       . Doxylamine Succinate, Sleep, (UNISOM PO) Take by mouth at bedtime.        . furosemide (LASIX) 40 MG tablet Take one by mouth twice daily       . gabapentin (NEURONTIN) 300 MG capsule Take 300 mg by mouth daily.        [name] glipiZIDE (GLUCOTROL) 5 MG tablet Take one by mouth daily       . Hydrocodone-APAP-Dietary Prod (HYDROCODONE-APAP-NUTRIT SUPP) 5-500 MG MISC Take by mouth. 1-2 po q 4 hrs       . isosorbide mononitrate (IMDUR) 30 MG 24 hr tablet Take one by mouth daily       . LEVEMIR FLEXPEN 100 UNIT/ML injection Use as directed      . levothyroxine (SYNTHROID, LEVOTHROID) 137 MCG tablet Take 137 mcg by mouth daily.        [name] lisinopril (PRINIVIL,ZESTRIL) 20 MG tablet Take 20 mg by mouth daily.        . metFORMIN (GLUCOPHAGE) 500 MG tablet Take two by mouth twice daily       . metoprolol (TOPROL-XL) 50 MG 24 hr tablet Take one by mouth twice daily       . nitroGLYCERIN (NITROSTAT) 0.4 MG SL tablet Place 0.4 mg under the tongue every 5 (five) minutes as needed.        Docia Barrier IN Inhale into the lungs. 3L via [place] continuous       . PLAVIX 75 MG tablet Take one by mouth daily       . rosuvastatin (CRESTOR) 20 MG tablet Take 40 mg by mouth daily.         Allergies  Allergen Reactions  . Amiodarone   . Morphine And Related Itching and Rash    Past Medical History  Diagnosis Date  . Ischemic cardiomyopathy     EF 40 to 45%  . CAD (coronary artery disease)   . Hx of CABG   . Hyperlipidemia   . V-tach     Previously on amiodarone  . Pulmonary fibrosis     Due to amiodarone  . PVD (peripheral vascular disease)     R CEA  . Hypothyroidism   . Insulin dependent diabetes mellitus   . H/O: lung cancer 2006    s/p resection and lymph node dissection  . Dementia   .  Systolic HF (heart failure)   . NSTEMI (non-ST elevated myocardial infarction) 2012  . Supplemental oxygen dependent   . Status post coronary artery stent placement 11/2010    DES to SVG to OM    Past Surgical History  Procedure Date  . Coronary artery bypass graft 1996    x 5  . Des stent to vein graft to om March 2012  . Right cea     History  Smoking status  . Former Smoker  . Quit date: 09/11/1994  Smokeless tobacco  . Not on file    History  Alcohol Use No    History reviewed. No pertinent family history.  Review of Systems: The review of systems is positive for chronic dyspnea. He is on home oxygen. He has not had further chest pain. He seems to be tolerating his medicines.  All other systems were reviewed and are negative.  Physical Exam: BP 152/42  Pulse 84  Ht 5\' 11"  (1.803 m)  Wt 199 lb 12.8 oz (90.629 kg)  BMI 27.87 kg/m2 Patient is very pleasant and in no acute distress. He is pleasantly demented. Skin is warm and dry. Color is normal.  HEENT is unremarkable. Normocephalic/atraumatic. PERRL. Sclera are nonicteric. Neck is supple. No masses. No JVD. Lungs are clear. Cardiac exam shows a regular rate and rhythm. Abdomen is soft and obese. Extremities  are without edema. Gait and ROM are intact. No gross neurologic deficits noted. He has oxygen in place. He does get short of breath with activity.  LABORATORY DATA:  Assessment / Plan:

## 2010-12-22 NOTE — Patient Instructions (Addendum)
Continue with your current medicines. Weigh yourself each morning and record. Take extra dose of diuretic for weight gain of 3 pounds in 24 hours. Limit sodium intake.  Use your NTG under your tongue for recurrent chest pain. May take one tablet every 5 minutes. If you are still having discomfort after 3 tablets in 15 minutes, call 911.  I will have you see Dr. Patty Sermons in about 6 weeks.

## 2010-12-22 NOTE — Discharge Summary (Signed)
NAME:  [name] [name], [name] [name] NO.:  [id number]  MEDICAL RECORD NO.:  [id number]           PATIENT TYPE:  I  LOCATION:  6529                         FACILITY:  [facility]  PHYSICIAN:  Cassell Clement, M.D. DATE OF BIRTH:  1925-03-28  DATE OF ADMISSION:  12/05/2010 DATE OF DISCHARGE:  12/08/2010                              DISCHARGE SUMMARY   PRIMARY CARDIOLOGY:  Cassell Clement, MD  PRIMARY CARE PROVIDER:  Kirstie Peri, MD  DISCHARGE DIAGNOSIS:  Acute-on-chronic systolic congestive heart failure.  SECONDARY DIAGNOSES: 1. Ischemic cardiomyopathy. 2. Coronary disease status post prior five-vessel bypass in 1996 with     successful drug-eluting stent placement of the ostial vein graft to     the obtuse marginal this admission. 3. Hypertension. 4. Hyperlipidemia. 5. 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 6. Pulmonary fibrosis. 7. Peripheral vascular disease status post right carotid     endarterectomy. 8. Hypothyroidism. 9. Insulin-dependent diabetes mellitus. 10.History of adenocarcinoma of the lung status post resection and     lymph node dissection in 2006. 11.Dementia.  ALLERGIES:  MORPHINE causes itch and rash, noted on this admission.  PROCEDURES: 1. Left heart cardiac catheterization performed on December 06, 2010,     revealing severe native multivessel disease with patent left     internal mammary artery to the left anterior descending, patent     vein graft to the posterior descending artery, and 90% ostial     stenosis within the vein graft to the obtuse marginal. 2. Successful percutaneous coronary intervention and stenting of the     ostial vein graft to the obtuse marginal with placement of a 3.0 x     15-mm Resolute Integrity drug-eluting stent.  HISTORY OF PRESENT ILLNESS:  This is an 77 year old male with prior history of coronary artery disease status post coronary artery bypass grafting in 1996 who also  has known ischemic cardiomyopathy with an EF 40-45%.  The patient was recently hospitalized at [facility] for acute heart failure and was noted to have elevated cardiac markers.  It was felt that this most likely represented a type 2 non-ST-elevation MI in the setting of acute heart failure and therefore, continued medical therapy was recommended.  Following discharge, the patient initially did well, however, he had worsening dyspnea, prompting presentation back to [facility] on December 04, 2010, with chest x-ray suggestive of CHF. The patient was admitted and subsequently evaluated by Cardiology with recommendation to diurese, and given recent non-STEMI and recurrent heart failure symptoms, recommendation was made to transfer him to [facility] for further evaluation and catheterization.  HOSPITAL COURSE:  The patient underwent diagnostic catheterization on December 06, 2010, which showed severe three-vessel coronary artery disease, patent LIMA to the LAD and patent vein graft to the PDA.  The vein graft to obtuse margin had a 90% ostial stenosis and this was felt to be the culprit vessel.  The patient was placed on aspirin and Plavix and underwent successful stenting of the vein graft to the obtuse marginal on December 07, 2010, with placement of a 3.0 x 15-mm Resolute Integrity drug-eluting  stent.  The patient tolerated this procedure well and postprocedure showed no recurrent chest pain or dyspnea.  He has been ambulating without difficulty and will be discharged home today in good condition.  DISCHARGE LABORATORY DATA:  Hemoglobin 9.7, hematocrit 31.2, WBC 8.0, platelets 197.  Sodium 141, potassium 5.2, chloride 102, CO2 is 30, BUN 15, creatinine 0.98, glucose 106, calcium 8.9.  TSH 4.286.  DISPOSITION:  The patient will be discharged home today in good condition.  FOLLOWUP APPOINTMENTS:  The patient will follow up with Norma Fredrickson, nurse practitioner, at [facility]iology, on December 22, 2010, at 8:45 a.m., follow up with Dr. Sherryll Burger as previously scheduled.  DISCHARGE MEDICATIONS: 1. Plavix 75 mg daily. 2. Aspirin 81 mg daily. 3. Lisinopril 20 mg daily. 4. Nitroglycerin 0.4 mg sublingual p.r.n. chest pain. 5. Toprol-XL 100 mg daily. 6. Aricept 5 mg daily. 7. Cinnamon over the counter 1 cap daily. 8. Crestor 40 mg daily. 9. Glucophage 1000 mg b.i.d., to resume on December 09, 2010. 10.Lasix 40 mg b.i.d. 11.Levemir 20 units daily. 12.Restoril 30 mg nightly p.r.n. 13.Synthroid 137 mcg daily.  OUTSTANDING LABORATORY STUDIES:  None.  DURATION OF DISCHARGE ENCOUNTER:  40 minutes including physician time.    Nicolasa Ducking, ANP   ______________________________ Cassell Clement, M.D.   CB/MEDQ  D:  12/08/2010  T:  12/09/2010  Job:  [id number]  cc:   Kirstie Peri, MD  Electronically Signed by Nicolasa Ducking ANP on 12/20/2010 04:07:54 PM Electronically Signed by Cassell Clement M.D. on 12/22/2010 12:31:37 PM

## 2010-12-22 NOTE — Assessment & Plan Note (Signed)
Hemoglobin at discharge was only 9.7. We will recheck today.

## 2010-12-22 NOTE — Assessment & Plan Note (Signed)
We will check labs today. I encouraged him to weigh daily. He is to use extra Lasix prn weight gain of 3 pounds over a 24 hour period.

## 2010-12-22 NOTE — Assessment & Plan Note (Signed)
S/P PCI to the SVG to the OM. Seems to be doing well. Will continue with his current plan of care. He may use NTG prn. His wife will alert [place] for any worsening of symptoms.

## 2010-12-22 NOTE — Assessment & Plan Note (Signed)
He is on his Plavix. He is not having chest pain. Will continue with current plan of care.

## 2010-12-23 ENCOUNTER — Telehealth: Payer: Self-pay | Admitting: *Deleted

## 2010-12-23 NOTE — Telephone Encounter (Signed)
Adv. Wife lab results and directions re- lasix dose

## 2010-12-29 DIAGNOSIS — R079 Chest pain, unspecified: Secondary | ICD-10-CM

## 2011-01-10 ENCOUNTER — Other Ambulatory Visit: Payer: Self-pay | Admitting: *Deleted

## 2011-01-10 DIAGNOSIS — R0602 Shortness of breath: Secondary | ICD-10-CM

## 2011-01-10 DIAGNOSIS — Z79899 Other long term (current) drug therapy: Secondary | ICD-10-CM

## 2011-01-13 ENCOUNTER — Encounter: Payer: Self-pay | Admitting: Cardiology

## 2011-01-13 ENCOUNTER — Ambulatory Visit (INDEPENDENT_AMBULATORY_CARE_PROVIDER_SITE_OTHER): Payer: Medicare Other | Admitting: Cardiology

## 2011-01-13 DIAGNOSIS — Z9861 Coronary angioplasty status: Secondary | ICD-10-CM

## 2011-01-13 DIAGNOSIS — R0602 Shortness of breath: Secondary | ICD-10-CM

## 2011-01-13 DIAGNOSIS — F0390 Unspecified dementia without behavioral disturbance: Secondary | ICD-10-CM

## 2011-01-13 DIAGNOSIS — E039 Hypothyroidism, unspecified: Secondary | ICD-10-CM

## 2011-01-13 DIAGNOSIS — Z955 Presence of coronary angioplasty implant and graft: Secondary | ICD-10-CM

## 2011-01-13 DIAGNOSIS — E119 Type 2 diabetes mellitus without complications: Secondary | ICD-10-CM | POA: Insufficient documentation

## 2011-01-13 NOTE — Progress Notes (Signed)
[name] [name] Date of Birth:  05-16-1925 [facility] [address]   [address] [place], [place]  [address] [phone]           Fax   [phone]  History of Present Illness: This 77 year old gentleman is seen for a scheduled followup office visit.  He has a past history of ischemic cardiomyopathy.  He had previous coronary artery bypass graft surgery x5 in 1996 by Dr. Tyrone Sage.  He has a past history of monomorphic ventricular tachycardia previously on amiodarone which had to be stopped because of worsening dyspnea and development of pulmonary fibrosis.  He has a history of diabetes mellitus.  He's had a history of adenocarcinoma of the lung and is status post resection and lymph node dissection in 2006 with no evidence of recurrence.  He was admitted to the hospital on 12/05/10 and underwent left heart cardiac catheterization on December 06, 2010 and he underwent successful percutaneous coronary intervention with stenting of the ostial vein graft to the obtuse marginal with placement of a drug-eluting stent.  The patient has done well since the procedure with no recurrent angina.He's also on aspirin 81 mg daily  Current Outpatient Prescriptions  Medication Sig Dispense Refill  . aspirin 325 MG tablet Take 81 mg by mouth daily.       . Cinnamon 500 MG capsule Take 500 mg by mouth daily.        [name] donepezil (ARICEPT) 10 MG tablet Take one by mouth daily       . Doxylamine Succinate, Sleep, (UNISOM PO) Take by mouth at bedtime.        . furosemide (LASIX) 40 MG tablet Taking 60mg  in the am  And 40mg  in the pm       . gabapentin (NEURONTIN) 300 MG capsule Take 300 mg by mouth daily.        [name] glipiZIDE (GLUCOTROL) 5 MG tablet Take one by mouth daily       . Hydrocodone-APAP-Dietary Prod (HYDROCODONE-APAP-NUTRIT SUPP) 5-500 MG MISC Take by mouth. 1-2 po q 4 hrs       . isosorbide mononitrate (IMDUR) 30 MG 24 hr tablet Take one by mouth daily       . LEVEMIR FLEXPEN 100  UNIT/ML injection Use as directed      . levothyroxine (SYNTHROID, LEVOTHROID) 137 MCG tablet Take 125 mcg by mouth daily.       [name] lisinopril (PRINIVIL,ZESTRIL) 20 MG tablet Take 20 mg by mouth daily.        . metFORMIN (GLUCOPHAGE) 500 MG tablet Take two by mouth twice daily       . metoprolol (TOPROL-XL) 50 MG 24 hr tablet 25 mg. Taking one daily       . nitroGLYCERIN (NITROSTAT) 0.4 MG SL tablet Place 0.4 mg under the tongue every 5 (five) minutes as needed.        Docia Barrier IN Inhale into the lungs. 3L via [place] continuous       . PLAVIX 75 MG tablet Take one by mouth daily       . rosuvastatin (CRESTOR) 20 MG tablet Take 40 mg by mouth daily.         Allergies  Allergen Reactions  . Amiodarone   . Morphine And Related Itching and Rash    Patient Active Problem List  Diagnoses  . DYSPNEA  . Ischemic cardiomyopathy  . CAD (coronary artery disease)  . Systolic HF (heart failure)  . NSTEMI (  non-ST elevated myocardial infarction)  . Status post coronary artery stent placement  . Anemia  . Diabetes mellitus  . Hypothyroidism  . Dementia    History  Smoking status  . Former Smoker  . Quit date: 09/11/1994  Smokeless tobacco  . Not on file    History  Alcohol Use No    No family history on file.  Review of Systems: Constitutional: no fever chills diaphoresis or fatigue or change in weight.  Head and neck: no hearing loss, no epistaxis, no photophobia or visual disturbance. Respiratory: No cough, shortness of breath or wheezing. Cardiovascular: No chest pain peripheral edema, palpitations. Gastrointestinal: No abdominal distention, no abdominal pain, no change in bowel habits hematochezia or melena. Genitourinary: No dysuria, no frequency, no urgency, no nocturia. Musculoskeletal:No arthralgias, no back pain, no gait disturbance or myalgias. Neurological: No dizziness, no headaches, no numbness, no seizures, no syncope, no weakness, no tremors. Hematologic: No  lymphadenopathy, no easy bruising. Psychiatric: No confusion, no hallucinations, no sleep disturbance.    Physical Exam: Filed Vitals:   01/13/11 1420  BP: 132/70  Pulse: 70  The general appearance reveals a pleasant slightly demented gentleman in no acute distress.  He is on round-the-clock oxygen.Pupils equal and reactive.   Extraocular Movements are full.  There is no scleral icterus.  The mouth and pharynx are normal.  The neck is supple.  The carotids reveal no bruits.  The jugular venous pressure is normal.  The thyroid is not enlarged.  There is no lymphadenopathy.The chest is clear to percussion and auscultation. There are no rales or rhonchi. Expansion of the chest is symmetrical.The precordium is quiet.  The first heart sound is normal.  The second heart sound is physiologically split.  There is no murmur gallop rub or click.  There is no abnormal lift or heave.The abdomen is soft and nontender. Bowel sounds are normal. The liver and spleen are not enlarged. There Are no abdominal masses. There are no bruits.The pedal pulses are good.  There is no phlebitis or edema.  There is no cyanosis or clubbing.Strength is normal and symmetrical in all extremities.  There is no lateralizing weakness.  There are no sensory deficits.  Normal integument with minimal bruising   Assessment / Plan:  Continue same medication.  Recheck in 4 months for followup office visit.  Careful diet and not gain any more weight.

## 2011-01-13 NOTE — Assessment & Plan Note (Signed)
The patient is diabetic.  He is on oral agents.  He has not been expressing any hypoglycemic episodes.  His appetite is good.  His blood sugar has been better controlled since he has been living at the assisted living facility where they monitor his diabetic diet better

## 2011-01-13 NOTE — Assessment & Plan Note (Signed)
The patient has not been experiencing any recurrent angina pectoris. 

## 2011-01-13 NOTE — Assessment & Plan Note (Signed)
The patient has a long history of chronic dyspnea and is on round-the-clock oxygen.  He was hospitalized from 03/26 through 12/08/10 with acute on chronic systolic congestive heart failure.  He has a history of ischemic cardiomyopathy and is status post CABG x5 in 1996.  He has a history as pulmonary fibrosis possibly secondary to amiodarone which previously had been used to suppress monomorphic ventricular tachycardia.  Since his last visit his dyspnea has not worsened he has not been expressing any chest pain.  His weight is up 5 pounds since last visit.

## 2011-01-23 ENCOUNTER — Encounter: Payer: 59 | Admitting: Cardiology

## 2011-01-24 NOTE — Assessment & Plan Note (Signed)
OFFICE VISIT   [name] [name], COPPOLA  DOB:  07-10-1925                                        June 10, 2009  CHART #:  [id number]   The patient is now 77 years old, returns for a follow up visit.  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  He continues to have problems with breathlessness and is on home oxygen.  His activity is somewhat limited because of this, but he seems to be  stable.  Previously bronchodilators have not benefited him.   PHYSICAL EXAMINATION:  Vital Signs:  His blood pressure is 100/50, pulse  is 93, respiratory rate is 22, O2 sats 96% on 2 liters.  Lungs:  His  breath sounds are distant but without wheezing.  Neck:  I do not  appreciate any cervical, or supraclavicular adenopathy.  Abdomen:  Moderately obese but without palpable masses.  He has no pedal edema or  calf tenderness.   Followup chest x-ray shows clear lung fields bilaterally with  postoperative changes.  There is no evidence of new pulmonary lesions  present.  Overall, the patient is limited by his underlying emphysema  and home oxygen dependence, but he does not appear to be any worse than  he was 6 months ago and by a chest x-ray there is no evidence of  recurrence.  I plan to see him back again in 6 months with a followup  chest x-ray.   [name] Plane, MD  Electronically Signed   EG/MEDQ  D:  06/10/2009  T:  06/10/2009  Job:  [id number]   cc:   [name] [name], M.D.

## 2011-01-24 NOTE — Assessment & Plan Note (Signed)
OFFICE VISIT   [name] [name], [name] [name]  DOB:  05-13-1925                                        May 23, 2007  CHART #:  [id number]   [name] [name] returns to the office in followup after his left upper  lobectomy and lymph node dissection for a stage I-B adenocarcinoma of  the lung resected on February 20, 2005.  At that time, the primary tumor was  4.3 cm in size, moderately differentiated, no squamous carcinoma.  He  continues to do relatively well at home without evidence of recurrence.  Functionally he has remained fairly active though he does become  dyspneic with exertion and continues on home oxygen about 75% of the  time and especially at night.  He denies any hemoptysis.  His  respiratory status has remained stable.  He does note over the last  three to four months losing approximately three pounds.   PHYSICAL EXAMINATION:  Vital signs:  His blood pressure 128/62, pulse is  86, respiratory rate is 18, his O2 SATs currently on room air at rest is  94%.  Lungs:  His breath sounds are distant but without active wheezing.  Neck:  I do not hear carotid bruits.  Abdomen:  Moderately obese  abdomen, but no palpable masses or palpable abdominal aneurysm.   A followup chest x-ray done at [facility] shows stable  postoperative changes, no new lesions were appreciated.   The patient is now approximately two years and three months following  resection without evidence of recurrence and doing relatively well.  I  plan to see him back in six months with a  followup chest x-ray.  Following that, if there is no evidence of  recurrence, possibly yearly after that.  Overall I am pleased with his  progress.   [name] Plane, MD  Electronically Signed   EG/MEDQ  D:  05/23/2007  T:  05/23/2007  Job:  [id number]   cc:   Lynett Fish, M.D.  Cassell Clement, M.D.

## 2011-01-24 NOTE — Assessment & Plan Note (Signed)
OFFICE VISIT   [name] [name], [name] [name]  DOB:  28-Sep-1924                                        December 08, 2008  CHART #:  [id number]   The patient returned to the office today for followup after resection of  adenosquamous moderately differentiated 4.3-cm carcinoma of the left  upper lobe, openly staged T2, N0, M0 tumor and underwent a left upper  lobectomy with node dissection in June 2006.  In addition, he had  coronary artery bypass grafting in 1996.  The patient is now almost 77  years old, notes that he is limited by shortness of breath and is on  home oxygen most of the time.  However, in spite of this, he notes that  he is still able to cut his grass with a riding lawn mower and remains  relatively active.  He notes he recently saw Dr. Patty Sermons earlier this  week.  In addition, he has had no hemoptysis.  He returns today for  followup chest x-ray.   PHYSICAL EXAMINATION:  VITAL SIGNS:  His blood pressure 124/65, pulses  88, respiratory rate 18, and O2 sats on 2 liters is 93%.  NECK:  I do not appreciate any cervical or supraclavicular adenopathy.  No axillary adenopathy.  LUNGS:  His breath sounds are distant, but without active wheezing.  EXTREMITIES:  He has no lower extremity tenderness.   He continues on Plavix, Toprol, Glucophage, Synthroid, Zetia,  lisinopril, aspirin, Neurontin, and Coumadin.  In addition, he notes  that Dr. Patty Sermons recently changed his statin from Lipitor to what he  thinks is Crestor, but he was unsure of the name.   Followup chest x-ray done today shows no evidence of recurrent lung  lesions.  Overall, considering he is now almost 4 years status post  resection of stage T2, N0, M0 carcinoma of the  lung, he seems to be doing reasonably well without evidence of  recurrence.  We will plan to see him back with a followup chest x-ray in  since 6 months.   Sheliah Plane, MD  Electronically Signed   EG/MEDQ  D:  12/08/2008   T:  12/09/2008  Job:  [id number]   cc:   Cassell Clement, M.D.

## 2011-01-24 NOTE — Assessment & Plan Note (Signed)
OFFICE VISIT   [name] [name], [name] [name]  DOB:  Oct 17, 1924                                        November 21, 2007  CHART #:  [id number]   HISTORY OF PRESENT ILLNESS:  [name] [name] returns to the office today in  followup from his left upper lobectomy, left node dissection for stage 1-  B adenocarcinoma, resected in June of 2006. At that time, primary tumor  was 4.3 cm in size, moderately differentiated non-adenosquamous  carcinoma of the lung. Since he was last seen in September of 2008, he  is using his home oxygen more frequently and progressive dyspnea has  limited his activities. He notes that this has been a gradual change and  not sudden onset. He is able to get around his house but notes that  without much activity, he becomes dyspneic. He denies any chest pain or  anginal symptoms. He has no other symptoms of congestive heart failure.  He denies pedal edema. Denies hemoptysis.   PHYSICAL EXAMINATION:  VITAL SIGNS:  Blood pressure 148/79, pulse 88 and  regular, respiratory rate 26. O2 saturation is 97% on 2 liters O2.  LYMPH:  I do not appreciate any cervical or supraclavicular adenopathy.  LUNGS:  His breath sounds are distant without any active wheezing. He  has no pedal edema.  ABDOMEN:  Examination is without palpable masses or tenderness.   LABORATORY DATA:  Followup chest x-ray shows postoperative changes from  his lung resection and bypass surgery.   MEDICATIONS:  He continues on Plavix 75, Toprol XL 25, Lipitor 80,  Glucophage 500, Synthroid, Zetia, Lisinopril, aspirin, Neurontin.   IMPRESSION/PLAN:  Although he has no active wheezing, his dyspnea has  become progressively worse. He has an appointment to see Dr. Patty Sermons  next week. I also will arrange for pulmonology to see him in initial  evaluation and see if they have anything that they can offer  as far as helping him with progressive dyspnea. I plan to see him back  in 6 months with a  followup chest x-ray.   Sheliah Plane, MD  Electronically Signed   EG/MEDQ  D:  11/21/2007  T:  11/21/2007  Job:  [id number]   cc:   Cassell Clement, M.D.  [facility]
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
## 2011-01-24 NOTE — Assessment & Plan Note (Signed)
OFFICE VISIT   [name] [name], [name] [name]  DOB:  [birth year]                                        December 09, 2009  CHART #:  [id number]   HISTORY:  The patient returns to the office now for followup for a T2 N0  M0 4.3-cm adenosquamous moderately differentiated carcinoma resected in  June 2006.  Prior to that, the patient had also undergone coronary  artery bypass grafting x5 in September 1996.  He now turns 77 years old  next week.  He remains on home oxygen, but continues to be functionally  notes even with his home oxygen, he cuts his grass with the riding lawn  mower.  He returns today for followup visit with chest x-ray.   PHYSICAL EXAMINATION:  His blood pressure is 126/80, pulse is 82,  respiratory rate 24, and O2 sats 97%.  His lungs have distant breath  sounds bilaterally, but with no active wheezing.  His O2 sats 97%  currently on 2 L.  I do not appreciate any cervical or supraclavicular  adenopathy.  He has no pedal edema.  No calf tenderness.   Chest x-ray is unchanged without evidence of recurrent disease.   IMPRESSION:  The patient is now 77 years old and sick 5 years following  resection of his carcinoma of the lung without evidence of recurrence.  We plan to see him back in 1 year with a chest x-ray.  He continues to  be followed by Dr. Patty Sermons.   Sheliah Plane, MD  Electronically Signed   EG/MEDQ  D:  12/09/2009  T:  12/10/2009  Job:  [id number]   cc:   Cassell Clement, M.D.

## 2011-01-27 NOTE — Consult Note (Signed)
NAME:  [name] [name], [name] [name] NO.:  [id number]   MEDICAL RECORD NO.:  [id number]          PATIENT TYPE:  INP   LOCATION:  2312                         FACILITY:  [facility]   PHYSICIAN:  Lauretta I. Odogwu, M.D.DATE OF BIRTH:  [birth year]   DATE OF CONSULTATION:  02/22/2005  DATE OF DISCHARGE:                                   CONSULTATION   REASON FOR CONSULTATION:  Lung cancer.   REFERRING PHYSICIAN:  Sheliah Plane, MD   HISTORY OF PRESENT ILLNESS:  [name] [name] is a pleasant 77 year old white  male found to have an incidental left upper lobe lesion per x-ray after he  presented to a primary care physician in [place] following a bout of  bronchitis.  A follow-up CT scan in [facility] in May 2006 showed an  irregular left upper lobe mass near the apex measuring 3.4 x 5 cm,  suspicious for primary lung cancer.  No mediastinal or hilar lymphadenopathy  was seen.  CT of the abdomen showed several low-attenuation areas to the  liver, possibly benign, with no adenopathy.  PET scan performed on Feb 03, 2005, showed a maximum SUV of the lesion in the left upper lobe of 12.6,  with no surrounding lymphadenopathy but a portocaval elongated lymph node  measuring 3.2 cm with mild uptake of 3.3 SUV was seen.  Also seen,  heterogenous increased uptake through the spine, especially in the mid-lower  thoracic spine.  In addition, a questionable intraparotid lesion versus  lymph node with an SUV of 6.3 was observed on the right.  He underwent a  left upper lobectomy by Dr. Tyrone Sage. Pathology demonstrated a moderately  differentiated adenosquamous carcinoma with negative surgical margins, no  pleural invasion, no direct extension of the tumor, no vascular invasion.  The four lymph nodes all were negative at the 4L and thoracic levels.  The  tumor was staged as a T2,N0,M0.   PAST MEDICAL HISTORY:  1.  Lung cancer as above.  2.  CAD, status post CABG, ejection fraction 51%.  3.   Hypercholesterolemia.  4.  Obesity.  5.  Hypothyroidism.  6.  Hypertension.  7.  Prior history of tobacco abuse, quitting 10 years ago.  8.  History of nonsustained V-tach, on amiodarone.  9.  History of hypoxic respiratory failure during this admission with      pulmonary edema, now on Lasix per critical care medicine.   SURGERIES:  1.  Status post left upper lobectomy February 20, 2005, Dr. Tyrone Sage.  2.  Status post CABG 1996 with recatheterization in 1999, Dr. Tyrone Sage.  3.  Status post right femoral repair for fracture.  4.  Status post appendectomy.  5.  Status post herniorrhaphy.  6.  Right CEA in 1996.   ALLERGIES:  No known drug allergies.   CURRENT MEDICATIONS:  1.  Cordarone 200 mg daily.  2.  Lipitor 40 mg daily.  3.  Dulcolax daily.  4.  Zinacef 1.5 mg q.12h. IV.  5.  Zetia 10 mg daily.  6.  Glucophage 500 mg b.i.d.  7.  Proscar 5 mg daily.  8.  Novolin as directed.  9.  Xopenex q.4h. nebulizer.  10. Synthroid 125 mcg daily.  11. Prinivil 20 mg daily.  12. Lopressor 25 mg b.i.d.  13. Ultram and Lasix p.r.n.   REVIEW OF SYSTEMS:  Remarkable for dyspnea on exertion and nonproductive  cough, chronic joint pains, and chronic constipation.  The rest of the  review of systems is negative.   FAMILY HISTORY:  Father died with prostate cancer.   SOCIAL HISTORY:  The patient is married.  He is retired.  He quit 10 years  ago the use of one pack a day of cigarettes for 50 years.  He denies alsohol  use. He lives in [place].   PHYSICAL EXAMINATION:  GENERAL:  This is a well-developed 77 year old white  male, mildly confused due to medications.  VITAL SIGNS:  Blood pressure 100/40, pulse 80, respirations 20, temperature  99.6, pulse oximetry 98% in 4 L.  Weight 208 pounds.  Height 5 feet 11  inches.  HEENT:  Normocephalic, atraumatic.  PERRLA.  Oral mucosa without thrush or  lesions.  He is edentulous.  NECK:  Supple, no cervical or supraclavicular masses.  LUNGS:   Wheezing and rales at the bases bilaterally.  No rhonchi.  No  axillary masses.  There is a chest tube in place, with some blood in the  drain.  CARDIOVASCULAR:  Regular rate and rhythm without murmurs, rubs or gallops.  ABDOMEN:  Obese, nontender, bowel sounds x4.  No palpable spleen or liver.  GENITOURINARY, RECTAL:  Deferred.  EXTREMITIES:  No clubbing or cyanosis.  No edema.  SKIN:  Without lesions or bruising.  There is a well-healed right  endarterectomy site and median sternotomy site.  No petechiae.  NEUROLOGIC:  Other than being acutely confused due to medications, nonfocal.   LABORATORY DATA:  Hemoglobin 12.2, hematocrit 37.5, white count 18.3.  Postoperatively, platelets 250, MCV 78.7.  PT 12.1, PTT 30, INR 0.9.  TSH  0.302.  Sodium 132, potassium 4.6, BUN 14, creatinine 1.0, glucose 177,  total bilirubin 0.8, alkaline phosphatase 96, AST 30, ALT 30, total protein  6.1, albumin 2.8, calcium 8.8.  Urinary culture negative.   ASSESSMENT AND PLAN:  Dr. Dalene Carrow has seen and evaluated the patient and the  chart has been reviewed.  The patient is stage I-B, T2, N0, M0, non-small  cell lung carcinoma.  When the patient has made full recovery from surgery  and pulmonary problems, Dr. Dalene Carrow has advocated adjuvant chemotherapy with  platinum-based regimen as survival benefit has been shown even after  complete resection.  Treatment plan was discussed with the patient in  some detail.  The patient lives in [place] and will likely follow up there.  We  will schedule the necessary follow up appointments.   Thank you very much for allowing [place] to participate in the care of this  patient.       SW/MEDQ  D:  02/24/2005  T:  02/24/2005  Job:  [id number]

## 2011-01-27 NOTE — Op Note (Signed)
NAMEMarland [name]  JEANMARC, VIERNES NO.:  [id number]   MEDICAL RECORD NO.:  [id number]          PATIENT TYPE:  OIB   LOCATION:  2899                         FACILITY:  [facility]   PHYSICIAN:  Ines Bloomer, M.D. DATE OF BIRTH:  06/23/1925   DATE OF PROCEDURE:  02/08/2005  DATE OF DISCHARGE:                                 OPERATIVE REPORT   PREOPERATIVE DIAGNOSIS:  Left upper lobe mass.   POSTOPERATIVE DIAGNOSIS:  Left upper lobe mass.   OPERATION PERFORMED:  Video bronchoscopy.   SURGEON:  Ines Bloomer, M.D.   ANESTHESIA:  Local.   DESCRIPTION OF PROCEDURE:  After local anesthesia with Cetacaine and  Xylocaine, the video bronchoscope was passed through the endotracheal tube.  The carina was in the midline.  The right upper lobe, right middle lobe and  right lower lobe orifices were normal.  The left main stem, left lower lobe  orifices were normal and no lesion could be seen in the left upper lobe.  Under fluoroscopic guidance, brushings and washings were taken from the  posterior segment of the left upper lobe.  The video bronchoscope was  removed.  The patient was then transferred to the recovery room in stable  condition.      DPB/MEDQ  D:  02/08/2005  T:  02/08/2005  Job:  [id number]   cc:   Cassell Clement, M.D.  [address], [address]  [place]  [place] [address]  Fax: [phone]

## 2011-01-27 NOTE — Op Note (Signed)
NAMEMarland Kitchen  JERMIAH, SODERMAN NO.:  [id number]   MEDICAL RECORD NO.:  [id number]          PATIENT TYPE:  INP   LOCATION:  2312                         FACILITY:  [facility]   PHYSICIAN:  Sheliah Plane, MD    DATE OF BIRTH:  17-May-1925   DATE OF PROCEDURE:  DATE OF DISCHARGE:                                 OPERATIVE REPORT   PREOPERATIVE DIAGNOSIS:  Left upper lobe lung lesion.   POSTOPERATIVE DIAGNOSIS:  Left upper lobe lung lesion.  Non-small cell  carcinoma.   PROCEDURE PERFORMED:  Bronchoscopy, left thoracotomy, left upper lobectomy  and a lymph node dissection.   SURGEON:  Sheliah Plane, M.D.   FIRST ASSISTANT:  Rowe Clack, P.A.-C.   BRIEF HISTORY:  The patient is a 77 year old male who had previously  undergone coronary artery bypass grafting approximately 10 years before with  use of a left internal mammary.  He now presents with a 4 cm mass in the  left upper lobe.  Preoperative evaluation with PFTs, PET scan and CT scan  did not reveal any evidence of metastatic disease.  Thoracotomy with  lobectomy was recommended to the patient who agreed and signed informed  consent.   DESCRIPTION OF THE PROCEDURE:  With a central line and arterial line in  place, the patient underwent general endotracheal anesthesia without  incident.  Through the endotracheal tube, fiberoptic bronchoscope was  passed.  There was no obvious endobronchial lesions.  A double lumen  endotracheal tube was placed and was in good position.  The patient was  turned in the lateral decubitus position with left side up.  Initially, a  very small incision was made, and the videoscope was introduced into the  chest.  This did not reveal any evidence of pleural implant, metastatic  disease, however, because of the previous bypass surgery, the left upper  lobe was stuck extensively to the middle mediastinum in the area of the  mammary graft.  A small thoracotomy incision was then made.  The  fissure was  developed because of some adhesions to the portion of the lower lobe.  A  small portion of this was taken with the specimen.  The pulmonary arteries  to the upper lobe were each identified, encircled and stapled with a  vascular stapler.  The bronchus was then divided next after stapling with a  TA stapler.  This allowed visualization of the pulmonary vein to the upper  lobe which was also divided with the vascular stapler.  With the hilum of  the lung freed, dissection both anterior and posterior along the mammary bed  could be carried out.  This was freed up as much as possible, and then a  small rim of lung tissue was left adherent to the mammary and using a  stapler to divide it.  The bronchial stump was then tested and was freed of  leak.  Tisseel was placed on the stump and other small areas of air leak  along the divided lung.  Two, 28 chest tubes were left in place.  __________  catheters were left in the  incision taking care not to incorporate them into  the closure.  Two pericostal sutures were placed.  The muscle layers were  closed with interrupted 0 Vicryl, running 3-0 Vicryl in the subcutaneous  tissue, 4-0  subcuticular stitch in the skin edges.  Dry dressings were applied.  At the  completion of the procedure, sponge and needle count was reported as  correct.  The patient was extubated and transferred to the Surgical  Intensive Care Unit for further postoperative care having tolerated the  procedure without obvious complication.       EG/MEDQ  D:  02/22/2005  T:  02/22/2005  Job:  [id number]   cc:   Cassell Clement, M.D.  [address], [address]  [place]  [place] [address]  Fax: [phone]   [facility]  Attn:  Arbutus Ped

## 2011-01-27 NOTE — Discharge Summary (Signed)
NAMEMarland Kitchen  [name], [name] NO.:  [name]   MEDICAL RECORD NO.:  [id number]          PATIENT TYPE:  INP   LOCATION:  2022                         FACILITY:  [facility]   PHYSICIAN:  Sheliah Plane, MD    DATE OF BIRTH:  Oct 26, 1924   DATE OF ADMISSION:  02/20/2005  DATE OF DISCHARGE:                                 DISCHARGE SUMMARY   PRIMARY DISCHARGE DIAGNOSIS:  Left upper lobe mass, positive for squamous  adenoma.   IN-HOSPITAL DIAGNOSES:  1.  Postoperative atrial fibrillation.  2.  Hypoxic respiratory failure.  3.  Delirium postoperatively.   SECONDARY DIAGNOSES:  1.  Diabetes mellitus.  2.  Hypercholesterolemia.  3.  History of coronary artery disease, status post coronary artery bypass      grafting.  4.  Hypothyroidism.  5.  Hypertension.  6.  History of tobacco abuse.  7.  History of nonsustained ventricular tachycardia on amiodarone.   ALLERGIES:  No known drug allergies.   OPERATION/PROCEDURE:  1.  Bronchoscopy.  2.  Left thoracotomy.  3.  Left upper lobectomy.  4.  Lymph node dissection.   HISTORY OF PRESENT ILLNESS AND HOSPITAL COURSE:  The patient is a 77-year-  old male who had previously undergone coronary artery bypass grafting  approximately 10 years before using the left internal mammary artery.  He  now presents to Dr. Dennie [name] office with a 4 cm mass in the left upper  lobe.  Preoperative evaluation with PFTs and PET scan and CT scan did not  reveal any evidence of metastatic disease. Thoracotomy with lobectomy was  recommended to the patient who agreed and signed informed consent.  The  patient does have a past medical history of coronary artery disease, status  post CABG as well as hypertension and hypercholesterolemia and nonsustained  ventricular tachycardia on amiodarone.  [name] [name] discussed the risks and  benefits of the above-mentioned procedure.  The patient acknowledges  understanding and wishes to proceed.  This was then  scheduled for February 20, 2005.   For details of the patient's past medical history and physical examination,  please see dictated history and physical.   HOSPITAL COURSE:  [name] [name] was taken to the operating room on February 20, 2005 where he underwent bronchoscopy, left thoracotomy, left upper lobectomy  and lymph node dissection.  The patient tolerated the procedure well and was  transferred to the intensive care unit in stable condition.  Following  surgery the patient was alert and oriented x 3.  Neuro was intact.  Cardiology was consulted following surgery to manage the patient from the  cardiac aspect.  They followed the patient during his hospital stay.   Postoperative day #1. There was a small air leak noted.  The patient had  minimal drainage from chest tube.  He was hemodynamically stable with  hematocrit of 34%.  Noted volume overload and the patient was started on  diuretics.   Postoperative day #2.  Chest x-ray was stable with mild edema. There was a  small air leak noted.  Diuretics were continued.  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  Margins were negative.  Lymph nodes negative.  At that  point [name] [name] was consulted.  He saw and evaluated [name] [name] February 22, 2005.  At that point stated that when the patient had made full recovery  from the surgery that he would advocate adjunct chemotherapy with  __________.  He will follow the patient as an outpatient.  On February 22, 2005,  the patient was seen to have hypoxic respiratory failure and pulmonary  consulted.  Lasix was continued IV and aggressive pulmonary toilet.  The  patient also developed slight confusion on postoperative day #2 and Haldol  was added p.r.n.  Later that evening on June 14, the patient had some  improvement in his respiratory function.   Postoperative day #3.  The patient's confusion has improved. He is alert and   oriented x3.  No air leak noted.  The patient is out of bed and ambulating  well.  Respiratory function was improving.   Postoperative day #4.  The patient developed postoperative atrial  fibrillation.  He was started on Cardizem drip and continued on his  amiodarone.  This was monitored.   Postoperative day #5.  The patient had rate controlled atrial fibrillation.  Continued Cardizem drip and amiodarone.  Respiratory was improving.  He was  ambulating well.  No air leak noted.  __________ are discontinued.  The  patient remained hemodynamically stable with hematocrit of 35%.   Postoperative day #6.  In the a.m. the patient went into atrial fibrillation  at a rate of 120's.  He was out of bed and ambulating well.  He remained  hemodynamically stable.  Chest x-ray was stable.  The patient was scheduled  to start on Coumadin later this evening.  At around 7 p.m. on postoperative  day #6, the patient converted to normal sinus rhythm and Coumadin was  discontinued.  The patient's respiratory failure had pretty much improved.  He is monitored.   Postoperative day #7.  The patient continued to progress well.  He remained  in normal sinus rhythm.  Out of bed, ambulating well.  Remained  hemodynamically stable.  The patient was transferred to 2000.  On  postoperative day #7.  the patient was progressing well.  The Coumadin again  was discontinued.  Diabetic CBG remained stable.  His saturations were 92-  94% on room air.  Incision and drain intact and healing well.  He remained  in normal sinus rhythm.  He was out of bed ambulating well.  The patient was  discharged home in stable condition on postoperative day #7.   FOLLOW UP:  Followup appointment was to be scheduled with [name] [name] for  one week.  The patient will obtain a PA and lateral chest x-ray one hour  prior to this appointment.  The patient will also be following up with [name] [name] in one week as well. The patient is to call  and schedule a  followup appointment with [name] [name].  The patient is to contact Dr.  Lonell [name] office to schedule a followup appointment in two to four weeks.   [name] [name] received instructions on activity level and incisional care.  He is still no driving until released to do so.  No heavy lifting greater  than 10 pounds.  He was told he is allowed to shower washing his incisions  using soap and water.  He is to contact the office  if he develops any  drainage or opening from any of his incision sites.  The patient nods his  understanding.  He is to ambulate three to four times a day.  Progress as  tolerated.  Educated no low-fat, low-salt diet.  Told to continue his  breathing exercises.   DISCHARGE MEDICATIONS:  1.  Amiodarone 200 mg p.o. b.i.d.  2.  Toprol-XL 25 mg p.o. daily.  3.  Lanoxin 0.125 mg daily.  4.  Lipitor 40 mg p.o. at night.  5.  Glucophage 500 mg p.o. b.i.d.  6.  Synthroid 125 mcg daily.  7.  Proscar 5 mg daily.  8.  Zetia 10 mg daily.  9.  Lisinopril 10 mg daily.  10. Aspirin 81 mg daily.  11. Oxycodone 5 mg one to two tablets p.o. q.4h. p.r.n. pain.       KMD/MEDQ  D:  02/28/2005  T:  02/28/2005  Job:  [id number]

## 2011-01-27 NOTE — Op Note (Signed)
NAMEMarland Kitchen  BUEFORD, ARP NO.:  [id number]   MEDICAL RECORD NO.:  [id number]          PATIENT TYPE:  OIB   LOCATION:  2899                         FACILITY:  [facility]   PHYSICIAN:  Ines Bloomer, M.D. DATE OF BIRTH:  05/10/1925   DATE OF PROCEDURE:  02/08/2005  DATE OF DISCHARGE:                                 OPERATIVE REPORT   PREOPERATIVE DIAGNOSIS:  Left upper lobe posterior segment mass.   POSTOPERATIVE DIAGNOSIS:  Left upper lobe posterior segment mass.   OPERATION PERFORMED:  Video bronchoscopy.   SURGEON:  Ines Bloomer, M.D.   ANESTHESIA:  Cetacaine, lidocaine and __________ sedation.   DESCRIPTION OF PROCEDURE:  After adequate anesthesia with Cetacaine and  lidocaine, the video bronchoscope  was passed through the mouth.  The cords  were in the midline.  The carina was in the midline.  The right upper lobe,  right middle lobe and right lower lobe __________ were normal.  Left  mainstem, left lower lobe and left upper lobe __________ were normal.  No  intrabronchial lesion could be seen under fluoroscopic guidance.  Brushings  and washings were taken from the posterior segment of the left upper lobe.  The video bronchoscope  was removed.  This patient tolerated the procedure  well and was returned to the recovery room in stable condition.      DPB/MEDQ  D:  02/08/2005  T:  02/08/2005  Job:  [id number]

## 2011-02-02 ENCOUNTER — Ambulatory Visit: Payer: Medicare Other | Admitting: Cardiology

## 2011-02-02 ENCOUNTER — Other Ambulatory Visit: Payer: Medicare Other | Admitting: *Deleted

## 2011-03-05 DIAGNOSIS — I4891 Unspecified atrial fibrillation: Secondary | ICD-10-CM

## 2011-03-06 ENCOUNTER — Telehealth: Payer: Self-pay | Admitting: Cardiology

## 2011-03-06 NOTE — Telephone Encounter (Signed)
Pt's wife called said pt was in hospital and wanted to schedule appt with Dr. Patty Sermons asap. I offered appt with Lawson Fiscal but she wanted to talk to you first

## 2011-03-06 NOTE — Telephone Encounter (Signed)
Scheduled post hospital with lori in am (not in [place])

## 2011-03-07 ENCOUNTER — Encounter: Payer: Self-pay | Admitting: Nurse Practitioner

## 2011-03-07 ENCOUNTER — Ambulatory Visit (INDEPENDENT_AMBULATORY_CARE_PROVIDER_SITE_OTHER): Payer: Medicare Other | Admitting: Nurse Practitioner

## 2011-03-07 VITALS — BP 110/70 | HR 89 | Ht 71.5 in | Wt 195.8 lb

## 2011-03-07 DIAGNOSIS — I493 Ventricular premature depolarization: Secondary | ICD-10-CM

## 2011-03-07 DIAGNOSIS — I255 Ischemic cardiomyopathy: Secondary | ICD-10-CM

## 2011-03-07 DIAGNOSIS — I2589 Other forms of chronic ischemic heart disease: Secondary | ICD-10-CM

## 2011-03-07 DIAGNOSIS — I5020 Unspecified systolic (congestive) heart failure: Secondary | ICD-10-CM

## 2011-03-07 DIAGNOSIS — R0609 Other forms of dyspnea: Secondary | ICD-10-CM

## 2011-03-07 DIAGNOSIS — R0600 Dyspnea, unspecified: Secondary | ICD-10-CM

## 2011-03-07 DIAGNOSIS — R0989 Other specified symptoms and signs involving the circulatory and respiratory systems: Secondary | ICD-10-CM

## 2011-03-07 DIAGNOSIS — I4949 Other premature depolarization: Secondary | ICD-10-CM

## 2011-03-07 LAB — CBC WITH DIFFERENTIAL/PLATELET
Basophils Absolute: 0 10*3/uL (ref 0.0–0.1)
Basophils Relative: 0.1 % (ref 0.0–3.0)
Eosinophils Absolute: 0.3 10*3/uL (ref 0.0–0.7)
Eosinophils Relative: 2.6 % (ref 0.0–5.0)
HCT: 39.1 % (ref 39.0–52.0)
Hemoglobin: 13 g/dL (ref 13.0–17.0)
Lymphocytes Relative: 13 % (ref 12.0–46.0)
Lymphs Abs: 1.5 10*3/uL (ref 0.7–4.0)
MCHC: 33.3 g/dL (ref 30.0–36.0)
MCV: 81.1 fl (ref 78.0–100.0)
Monocytes Absolute: 0.8 10*3/uL (ref 0.1–1.0)
Monocytes Relative: 7 % (ref 3.0–12.0)
Neutro Abs: 9.2 10*3/uL — ABNORMAL HIGH (ref 1.4–7.7)
Neutrophils Relative %: 77.3 % — ABNORMAL HIGH (ref 43.0–77.0)
Platelets: 260 10*3/uL (ref 150.0–400.0)
RBC: 4.82 Mil/uL (ref 4.22–5.81)
RDW: 19.5 % — ABNORMAL HIGH (ref 11.5–14.6)
WBC: 11.9 10*3/uL — ABNORMAL HIGH (ref 4.5–10.5)

## 2011-03-07 LAB — BASIC METABOLIC PANEL
BUN: 25 mg/dL — ABNORMAL HIGH (ref 6–23)
CO2: 31 mEq/L (ref 19–32)
Calcium: 9.5 mg/dL (ref 8.4–10.5)
Chloride: 99 mEq/L (ref 96–112)
Creatinine, Ser: 1.4 mg/dL (ref 0.4–1.5)
GFR: 50.46 mL/min — ABNORMAL LOW (ref >60.00)
Glucose, Bld: 125 mg/dL — ABNORMAL HIGH (ref 70–99)
Potassium: 5.4 mEq/L — ABNORMAL HIGH (ref 3.5–5.1)
Sodium: 140 mEq/L (ref 135–145)

## 2011-03-07 LAB — BRAIN NATRIURETIC PEPTIDE: Pro B Natriuretic peptide (BNP): 260 pg/mL — ABNORMAL HIGH (ref 0.0–100.0)

## 2011-03-07 NOTE — Progress Notes (Signed)
Agree with assessment and plan.  No other recommendations at this time.

## 2011-03-07 NOTE — Progress Notes (Signed)
[name] [name] Date of Birth: 1925/06/16   History of Present Illness: [name] [name] is seen today for a post hospital visit. He is seen for Dr. Patty Sermons. He is here with his wife. She provides most of the history. He has multiple medical issues and is quite complex.  He is demented. He was just discharged yesterday from [facility]. He had been admitted last Thursday. She says he had a "touch of pneumonia and fluid". There was some issue with his rhythm but she does not know exactly what that was. We do not have any of those records. He remains short of breath. His weight is gradually going down. He doesn't really watch his salt according to the wife. No chest pain reported.   Current Outpatient Prescriptions on File Prior to Visit  Medication Sig Dispense Refill  . aspirin 325 MG tablet Take 81 mg by mouth daily.       . Cinnamon 500 MG capsule Take 500 mg by mouth daily.        [name] donepezil (ARICEPT) 10 MG tablet Take one by mouth daily       . Doxylamine Succinate, Sleep, (UNISOM PO) Take by mouth as needed.       . furosemide (LASIX) 40 MG tablet Taking 40mg  in the am  And 40mg  in the pm       . gabapentin (NEURONTIN) 300 MG capsule Take 300 mg by mouth daily.        [name] glipiZIDE (GLUCOTROL) 5 MG tablet Take one by mouth daily       . isosorbide mononitrate (IMDUR) 30 MG 24 hr tablet Take one by mouth daily       . LEVEMIR FLEXPEN 100 UNIT/ML injection Use as directed      . levothyroxine (SYNTHROID, LEVOTHROID) 137 MCG tablet Take 125 mcg by mouth daily.       [name] lisinopril (PRINIVIL,ZESTRIL) 20 MG tablet Take 20 mg by mouth daily.        . metFORMIN (GLUCOPHAGE) 500 MG tablet Take two by mouth twice daily       . metoprolol (TOPROL-XL) 50 MG 24 hr tablet 25 mg. Taking one daily       . nitroGLYCERIN (NITROSTAT) 0.4 MG SL tablet Place 0.4 mg under the tongue every 5 (five) minutes as needed.        Docia Barrier IN Inhale into the lungs. 3L via [place] continuous       . PLAVIX 75 MG tablet Take  one by mouth daily       . rosuvastatin (CRESTOR) 20 MG tablet Take 40 mg by mouth daily.       [name] DISCONTD: Hydrocodone-APAP-Dietary Prod (HYDROCODONE-APAP-NUTRIT SUPP) 5-500 MG MISC Take by mouth. 1-2 po q 4 hrs         Allergies  Allergen Reactions  . Amiodarone   . Morphine And Related Itching and Rash    Past Medical History  Diagnosis Date  . Ischemic cardiomyopathy     EF 40 to 45%  . CAD (coronary artery disease)   . Hx of CABG   . Hyperlipidemia   . V-tach     Previously on amiodarone  . Pulmonary fibrosis     Due to amiodarone  . PVD (peripheral vascular disease)     R CEA  . Hypothyroidism   . Insulin dependent diabetes mellitus   . H/O: lung cancer 2006    s/p resection and lymph node dissection  . Dementia   .  Systolic HF (heart failure)   . NSTEMI (non-ST elevated myocardial infarction) 2012  . Supplemental oxygen dependent   . Status post coronary artery stent placement 11/2010    DES to SVG to OM    Past Surgical History  Procedure Date  . Coronary artery bypass graft 1996    x 5  . Des stent to vein graft to om March 2012  . Right cea   . Cardiac catheterization March 2012    History  Smoking status  . Former Smoker  . Quit date: 09/11/1994  Smokeless tobacco  . Not on file    History  Alcohol Use No    History reviewed. No pertinent family history.  Review of Systems: The review of systems is positive for shortness of breath with and without exertion. He remains on oxygen. He is demented.  He appears to be NYHA class 3/4. All other systems were reviewed and are negative.  Physical Exam: BP 110/70  Pulse 89  Ht 5' 11.5" (1.816 m)  Wt 195 lb 12.8 oz (88.814 kg)  BMI 26.93 kg/m2  SpO2 96% Patient is very pleasant. He does get short of breath with talking and very little activity. Skin is warm and dry. Color is normal.  HEENT is unremarkable. Normocephalic/atraumatic. PERRL. Sclera are nonicteric. Neck is supple. No masses. No JVD.  Lungs are clear. Cardiac exam shows a regular rate and rhythm. He does have frequent ectopics.  Abdomen is soft. Extremities are without edema today. Gait and ROM are intact. He is using a cane.  No gross neurologic deficits noted.  LABORATORY DATA: EKG shows sinus with PVC's. He has diffuse ST and T wave changes.    Assessment / Plan:

## 2011-03-07 NOTE — Assessment & Plan Note (Signed)
His weight is down a little but it is hard to know if he is compensated or not. He has underlying lung disease which just compounds the issue. We will recheck his labs today. We may need to adjust his Lasix up. He may need a repeat CT of his chest with his prior history of lung cancer. He has not had a recent CT in quite some time. We will obtain his records from [facility] for review. He is currently in sinus rhythm. If he had atrial fib, he is not a good candidate for coumadin in my opinion. He does remain on his Plavix for his recent stent back in March.  I will review further with Dr. Patty Sermons. I will see him back in about 2 weeks. Overall, his prognosis looks rather guarded to me. His wife is agreeable to this plan and will call for any problems in the interim.

## 2011-03-07 NOTE — Patient Instructions (Signed)
Stay on your current medicines for now. We are going to check your lab work I will get his records from [place] We may need to consider a CT of the chest to look at your lungs further I will see you in about 2 weeks

## 2011-03-16 ENCOUNTER — Telehealth: Payer: Self-pay | Admitting: *Deleted

## 2011-03-16 NOTE — Progress Notes (Signed)
Advised wife of labs and recheck of xray

## 2011-03-16 NOTE — Telephone Encounter (Signed)
Message copied by Burnell Blanks on Thu Mar 16, 2011  9:35 AM ------      Message from: Cassell Clement      Created: Thu Mar 09, 2011  9:59 AM       I would suggest getting a repeat chest x-ray prior to his return in 2 weeks and then make a decision about chest CT depending on results.

## 2011-03-16 NOTE — Progress Notes (Signed)
Addended by: Regis Bill B on: 03/16/2011 09:34 AM   Modules accepted: Orders

## 2011-03-16 NOTE — Progress Notes (Signed)
Advised wife of labs and recheck of chest xray.  Will go prior to office visit next week for xray.

## 2011-03-16 NOTE — Progress Notes (Signed)
Advised wife of results.  

## 2011-03-16 NOTE — Telephone Encounter (Signed)
Advised wife of xray recheck and labs

## 2011-03-17 ENCOUNTER — Encounter: Payer: Self-pay | Admitting: Cardiology

## 2011-03-21 ENCOUNTER — Telehealth: Payer: Self-pay | Admitting: *Deleted

## 2011-03-21 ENCOUNTER — Ambulatory Visit (INDEPENDENT_AMBULATORY_CARE_PROVIDER_SITE_OTHER): Payer: Medicare Other | Admitting: Nurse Practitioner

## 2011-03-21 ENCOUNTER — Encounter: Payer: Self-pay | Admitting: Nurse Practitioner

## 2011-03-21 ENCOUNTER — Ambulatory Visit
Admission: RE | Admit: 2011-03-21 | Discharge: 2011-03-21 | Disposition: A | Discharge status: Home / Self Care | Payer: Medicare Other | Source: Ambulatory Visit | Attending: Nurse Practitioner | Admitting: Nurse Practitioner

## 2011-03-21 VITALS — BP 118/50 | HR 60 | Ht 71.5 in | Wt 197.4 lb

## 2011-03-21 DIAGNOSIS — Z85118 Personal history of other malignant neoplasm of bronchus and lung: Secondary | ICD-10-CM

## 2011-03-21 DIAGNOSIS — I2510 Atherosclerotic heart disease of native coronary artery without angina pectoris: Secondary | ICD-10-CM

## 2011-03-21 DIAGNOSIS — I255 Ischemic cardiomyopathy: Secondary | ICD-10-CM

## 2011-03-21 DIAGNOSIS — R0602 Shortness of breath: Secondary | ICD-10-CM

## 2011-03-21 DIAGNOSIS — I480 Paroxysmal atrial fibrillation: Secondary | ICD-10-CM | POA: Insufficient documentation

## 2011-03-21 DIAGNOSIS — R0600 Dyspnea, unspecified: Secondary | ICD-10-CM

## 2011-03-21 DIAGNOSIS — I4891 Unspecified atrial fibrillation: Secondary | ICD-10-CM

## 2011-03-21 DIAGNOSIS — I2589 Other forms of chronic ischemic heart disease: Secondary | ICD-10-CM

## 2011-03-21 LAB — BASIC METABOLIC PANEL
BUN: 21 mg/dL (ref 6–23)
CO2: 35 mEq/L — ABNORMAL HIGH (ref 19–32)
Calcium: 9.2 mg/dL (ref 8.4–10.5)
Chloride: 98 mEq/L (ref 96–112)
Creatinine, Ser: 0.9 mg/dL (ref 0.4–1.5)
GFR: 81.22 mL/min (ref >60.00)
Glucose, Bld: 123 mg/dL — ABNORMAL HIGH (ref 70–99)
Potassium: 4.6 mEq/L (ref 3.5–5.1)
Sodium: 141 mEq/L (ref 135–145)

## 2011-03-21 LAB — BRAIN NATRIURETIC PEPTIDE: Pro B Natriuretic peptide (BNP): 362 pg/mL — ABNORMAL HIGH (ref 0.0–100.0)

## 2011-03-21 MED ORDER — IOHEXOL 300 MG/ML  SOLN
75.0000 mL | Freq: Once | INTRAMUSCULAR | Status: AC | PRN
  Administered 2011-03-21: 75 mL via INTRAVENOUS

## 2011-03-21 NOTE — Assessment & Plan Note (Signed)
He seems to be in sinus by exam today. No change in medicines for now.

## 2011-03-21 NOTE — Progress Notes (Signed)
[name] [name] Date of Birth: 1925/01/03   History of Present Illness: [name] [name] is seen today for a 2 week check. He is seen for Dr. Patty Sermons. He is here with his wife. He remains very short of breath. She thinks it is mostly fluid buildup. Last BNP was not that impressive. He has a history of lung cancer. Last CT of his chest was back in 2009. CXR today showed no acute process. He is not doing as well as his wife would like. She says his weight is going down. He has had one episode of chest pain since he was last here and took NTG x 1 with relief. He has not had recurrence. He remains very limited. No cough, fever or chills reported. He had been recently admitted at [facility] with pneumonia/CHF/atrial fib. He is no longer a candidate for antiarrhythmic therapy. He denies palpitations. He says he is not dizzy. He has a generalized feeling of weakness.   Current Outpatient Prescriptions on File Prior to Visit  Medication Sig Dispense Refill  . aspirin 325 MG tablet Take 81 mg by mouth daily.       . Cinnamon 500 MG capsule Take 500 mg by mouth daily.        [name] donepezil (ARICEPT) 10 MG tablet Take one by mouth daily       . furosemide (LASIX) 40 MG tablet 40 mg 2 (two) times daily. Taking 40mg  in the am  And 40mg  in the pm       . gabapentin (NEURONTIN) 300 MG capsule Take 300 mg by mouth daily.        [name] glipiZIDE (GLUCOTROL) 5 MG tablet Take one by mouth daily       . isosorbide mononitrate (IMDUR) 30 MG 24 hr tablet Take one by mouth daily       . LEVEMIR FLEXPEN 100 UNIT/ML injection Use as directed      . levothyroxine (SYNTHROID, LEVOTHROID) 137 MCG tablet Take 125 mcg by mouth daily.       [name] lisinopril (PRINIVIL,ZESTRIL) 20 MG tablet Take 20 mg by mouth daily.        . metFORMIN (GLUCOPHAGE) 500 MG tablet Take two by mouth twice daily       . metoprolol (TOPROL-XL) 50 MG 24 hr tablet 25 mg. Taking one daily       . nitroGLYCERIN (NITROSTAT) 0.4 MG SL tablet Place 0.4 mg under the tongue  every 5 (five) minutes as needed.        Docia Barrier IN Inhale into the lungs. 3L via [place] continuous       . PLAVIX 75 MG tablet Take one by mouth daily       . rosuvastatin (CRESTOR) 20 MG tablet Take 40 mg by mouth daily.       . Doxylamine Succinate, Sleep, (UNISOM PO) Take by mouth as needed.         Allergies  Allergen Reactions  . Amiodarone   . Morphine And Related Itching and Rash    Past Medical History  Diagnosis Date  . Ischemic cardiomyopathy     EF 40 to 45%  . CAD (coronary artery disease)   . Hx of CABG   . Hyperlipidemia   . V-tach     Previously on amiodarone  . Pulmonary fibrosis     Due to amiodarone  . PVD (peripheral vascular disease)     R CEA  . Hypothyroidism   . Insulin dependent diabetes  mellitus   . H/O: lung cancer 2006    s/p resection and lymph node dissection  . Dementia   . Systolic HF (heart failure)   . NSTEMI (non-ST elevated myocardial infarction) 2012  . Supplemental oxygen dependent   . Status post coronary artery stent placement 11/2010    DES to SVG to OM    Past Surgical History  Procedure Date  . Coronary artery bypass graft 1996    x 5  . Des stent to vein graft to om March 2012  . Right cea   . Cardiac catheterization March 2012    History  Smoking status  . Former Smoker  . Quit date: 09/11/1994  Smokeless tobacco  . Not on file    History  Alcohol Use No    History reviewed. No pertinent family history.  Review of Systems: The review of systems is positive for shortness of breath. He remains on oxygen. No real swelling. His cough is gone. His wife notes that he is "awfully weak".  All other systems were reviewed and are negative.  Physical Exam: BP 118/50  Pulse 60  Ht 5' 11.5" (1.816 m)  Wt 197 lb 6.4 oz (89.54 kg)  BMI 27.15 kg/m2 Patient is very pleasant and in no acute distress. He actually looks better to me today.  Skin is warm and dry. Color is normal.  HEENT is unremarkable.  Normocephalic/atraumatic. PERRL. Sclera are nonicteric. Neck is supple. No masses. No JVD. Lungs are clear. Cardiac exam shows a regular rate and rhythm. Abdomen is soft. Extremities are without edema today. Gait and ROM are intact. No gross neurologic deficits noted.  LABORATORY DATA:  CXR done earlier today shows no acute abnormalities with emphysema.   Assessment / Plan:

## 2011-03-21 NOTE — Progress Notes (Signed)
Advised wife  

## 2011-03-21 NOTE — Telephone Encounter (Signed)
Advised wife of ct scan, no signs of cancer per Lawson Fiscal

## 2011-03-21 NOTE — Assessment & Plan Note (Signed)
He remains short of breath. BNP is rechecked today. We will arrange for the CT scan.

## 2011-03-21 NOTE — Assessment & Plan Note (Signed)
This is a chronic issue. I think it is more pulmonary in etiology. He remains on his oxygen. His wife is worried that this is lung cancer and would like to go ahead and repeat the CT scan. Last study was in 2009. Will try to arrange for this afternoon.

## 2011-03-21 NOTE — Telephone Encounter (Signed)
Message copied by Burnell Blanks on Tue Mar 21, 2011  4:54 PM ------      Message from: Rosalio Macadamia      Created: Tue Mar 21, 2011  3:17 PM       Was reported at OV. CT ordered.

## 2011-03-21 NOTE — Assessment & Plan Note (Signed)
He has had DES to the SVG to the OM back in March. He remains on Plavix. He has had one episode of chest pain. He will continue to use NTG prn. No change in his current medicines for now.

## 2011-03-22 NOTE — Progress Notes (Signed)
Advised wife on 7/10

## 2011-03-23 ENCOUNTER — Encounter: Payer: Self-pay | Admitting: *Deleted

## 2011-03-23 ENCOUNTER — Telehealth: Payer: Self-pay | Admitting: *Deleted

## 2011-03-23 NOTE — Telephone Encounter (Signed)
Advised wife of lab and CT scan results

## 2011-05-22 ENCOUNTER — Ambulatory Visit: Payer: Medicare Other | Admitting: Cardiology

## 2011-05-24 DIAGNOSIS — R079 Chest pain, unspecified: Secondary | ICD-10-CM

## 2011-05-25 DIAGNOSIS — I2510 Atherosclerotic heart disease of native coronary artery without angina pectoris: Secondary | ICD-10-CM

## 2011-05-31 ENCOUNTER — Telehealth: Payer: Self-pay | Admitting: Cardiology

## 2011-05-31 NOTE — Telephone Encounter (Signed)
Called wanting to reschedule her husbands appointment that he missed on 9/10 because he was hospitalized for fluid around his. Please call back.

## 2011-05-31 NOTE — Telephone Encounter (Signed)
Scheduled follow-up

## 2011-06-05 ENCOUNTER — Ambulatory Visit: Payer: Medicare Other | Admitting: Nurse Practitioner

## 2011-06-05 ENCOUNTER — Inpatient Hospital Stay (HOSPITAL_COMMUNITY)
Admission: AD | Admit: 2011-06-05 | Discharge: 2011-06-07 | DRG: 280 | Disposition: A | Discharge status: Home / Self Care | Payer: Medicare Other | Source: Other Acute Inpatient Hospital | Attending: Internal Medicine | Admitting: Internal Medicine

## 2011-06-05 ENCOUNTER — Ambulatory Visit: Payer: Medicare Other | Admitting: Cardiology

## 2011-06-05 ENCOUNTER — Other Ambulatory Visit: Payer: Medicare Other | Admitting: *Deleted

## 2011-06-05 DIAGNOSIS — Z7982 Long term (current) use of aspirin: Secondary | ICD-10-CM

## 2011-06-05 DIAGNOSIS — I2510 Atherosclerotic heart disease of native coronary artery without angina pectoris: Secondary | ICD-10-CM

## 2011-06-05 DIAGNOSIS — I509 Heart failure, unspecified: Secondary | ICD-10-CM

## 2011-06-05 DIAGNOSIS — I5023 Acute on chronic systolic (congestive) heart failure: Secondary | ICD-10-CM | POA: Diagnosis present

## 2011-06-05 DIAGNOSIS — R079 Chest pain, unspecified: Secondary | ICD-10-CM

## 2011-06-05 DIAGNOSIS — I739 Peripheral vascular disease, unspecified: Secondary | ICD-10-CM | POA: Diagnosis present

## 2011-06-05 DIAGNOSIS — E119 Type 2 diabetes mellitus without complications: Secondary | ICD-10-CM | POA: Diagnosis present

## 2011-06-05 DIAGNOSIS — Z7902 Long term (current) use of antithrombotics/antiplatelets: Secondary | ICD-10-CM

## 2011-06-05 DIAGNOSIS — Z951 Presence of aortocoronary bypass graft: Secondary | ICD-10-CM

## 2011-06-05 DIAGNOSIS — I4891 Unspecified atrial fibrillation: Secondary | ICD-10-CM | POA: Diagnosis present

## 2011-06-05 DIAGNOSIS — E785 Hyperlipidemia, unspecified: Secondary | ICD-10-CM | POA: Diagnosis present

## 2011-06-05 DIAGNOSIS — F0390 Unspecified dementia without behavioral disturbance: Secondary | ICD-10-CM | POA: Diagnosis present

## 2011-06-05 DIAGNOSIS — I214 Non-ST elevation (NSTEMI) myocardial infarction: Principal | ICD-10-CM | POA: Diagnosis present

## 2011-06-05 DIAGNOSIS — Z8679 Personal history of other diseases of the circulatory system: Secondary | ICD-10-CM

## 2011-06-05 DIAGNOSIS — Z79899 Other long term (current) drug therapy: Secondary | ICD-10-CM

## 2011-06-05 DIAGNOSIS — Z886 Allergy status to analgesic agent status: Secondary | ICD-10-CM

## 2011-06-05 LAB — CARDIAC PANEL(CRET KIN+CKTOT+MB+TROPI): CK, MB: 3.1 ng/mL (ref 0.3–4.0)

## 2011-06-05 LAB — HEPARIN LEVEL (UNFRACTIONATED): Heparin Unfractionated: 0.26 IU/mL — ABNORMAL LOW (ref 0.30–0.70)

## 2011-06-05 LAB — TSH: TSH: 1.724 u[IU]/mL (ref 0.350–4.500)

## 2011-06-06 DIAGNOSIS — I214 Non-ST elevation (NSTEMI) myocardial infarction: Secondary | ICD-10-CM

## 2011-06-06 LAB — GLUCOSE, CAPILLARY
Glucose-Capillary: 140 mg/dL — ABNORMAL HIGH (ref 70–99)
Glucose-Capillary: 148 mg/dL — ABNORMAL HIGH (ref 70–99)

## 2011-06-06 LAB — BASIC METABOLIC PANEL
Calcium: 8.9 mg/dL (ref 8.4–10.5)
GFR calc Af Amer: >60 mL/min (ref >60)
GFR calc non Af Amer: >60 mL/min (ref >60)
Potassium: 3.6 mEq/L (ref 3.5–5.1)
Sodium: 141 mEq/L (ref 135–145)

## 2011-06-06 LAB — CBC
Hemoglobin: 10.9 g/dL — ABNORMAL LOW (ref 13.0–17.0)
Platelets: 162 10*3/uL (ref 150–400)
RBC: 3.98 MIL/uL — ABNORMAL LOW (ref 4.22–5.81)
WBC: 7.3 10*3/uL (ref 4.0–10.5)

## 2011-06-06 LAB — HEPARIN LEVEL (UNFRACTIONATED): Heparin Unfractionated: 0.34 IU/mL (ref 0.30–0.70)

## 2011-06-06 LAB — D-DIMER, QUANTITATIVE: D-Dimer, Quant: 0.64 ug/mL-FEU — ABNORMAL HIGH (ref 0.00–0.48)

## 2011-06-07 ENCOUNTER — Telehealth: Payer: Self-pay | Admitting: Cardiology

## 2011-06-07 ENCOUNTER — Inpatient Hospital Stay (HOSPITAL_COMMUNITY): Payer: Medicare Other

## 2011-06-07 DIAGNOSIS — I5023 Acute on chronic systolic (congestive) heart failure: Secondary | ICD-10-CM

## 2011-06-07 LAB — BASIC METABOLIC PANEL
BUN: 12 mg/dL (ref 6–23)
CO2: 35 mEq/L — ABNORMAL HIGH (ref 19–32)
GFR calc non Af Amer: >60 mL/min (ref >60)
Glucose, Bld: 126 mg/dL — ABNORMAL HIGH (ref 70–99)
Potassium: 4.3 mEq/L (ref 3.5–5.1)
Sodium: 143 mEq/L (ref 135–145)

## 2011-06-07 LAB — GLUCOSE, CAPILLARY
Glucose-Capillary: 121 mg/dL — ABNORMAL HIGH (ref 70–99)
Glucose-Capillary: 135 mg/dL — ABNORMAL HIGH (ref 70–99)

## 2011-06-07 LAB — CBC
HCT: 34.3 % — ABNORMAL LOW (ref 39.0–52.0)
Hemoglobin: 11.1 g/dL — ABNORMAL LOW (ref 13.0–17.0)
MCH: 27.9 pg (ref 26.0–34.0)
MCHC: 32.4 g/dL (ref 30.0–36.0)
RBC: 3.98 MIL/uL — ABNORMAL LOW (ref 4.22–5.81)

## 2011-06-07 NOTE — Telephone Encounter (Signed)
Lasix was not on the list of meds at time of admission.  If he was on Lasix BID before, then I want him on BID now.

## 2011-06-07 NOTE — Telephone Encounter (Signed)
Came home today from [facility] and she has questions about his discharge medication.

## 2011-06-07 NOTE — Telephone Encounter (Signed)
Just home from hospital today and Lasix was decreased from 40 mg twice a day to once a day.  Is this the correct dose or should he be on twice daily? Two weeks ago he was full of fluid

## 2011-06-08 NOTE — Telephone Encounter (Signed)
Advised wife to continue lasix twice a day as Dr. Patty Sermons instructed

## 2011-06-09 ENCOUNTER — Encounter: Payer: Self-pay | Admitting: Cardiology

## 2011-06-13 ENCOUNTER — Encounter: Payer: Self-pay | Admitting: *Deleted

## 2011-06-21 NOTE — Discharge Summary (Signed)
NAMEMarland [name]  WIATT, MAHABIR NO.:  [id number]  MEDICAL RECORD NO.:  [id number]  LOCATION:  3708                         FACILITY:  [facility]  PHYSICIAN:  Cassell Clement, M.D. DATE OF BIRTH:  1925-05-15  DATE OF ADMISSION:  06/05/2011 DATE OF DISCHARGE:  06/07/2011                              DISCHARGE SUMMARY   PRIMARY CARDIOLOGIST:  Learta Codding, MD,FACC.  PRIMARY CARE PROVIDER:  __________  DISCHARGE DIAGNOSIS:  Acute-on-chronic systolic congestive heart failure.  SECONDARY DIAGNOSES: 1. Non-ST-segment elevation myocardial infarction. 2. Coronary artery disease status post remote coronary artery bypass     grafting. 3. History of atrial fibrillation, thought to be a poor Coumadin     candidate. 4. Dementia. 5. Diabetes mellitus. 6. Hyperlipidemia. 7. Peripheral arterial disease. 8. History of monomorphic ventricular tachycardia.  ALLERGIES:  MORPHINE.  PROCEDURES:  Left heart cardiac catheterization, revealing severe native three-vessel coronary artery disease with patent LIMA to the LAD (70% stenosis in the left subclavian).  The sequential vein graft to the first and second obtuse marginal had 50%-60% ostial stenosis at the stent site.  The sequential vein graft to PDA and PLA had 70%-80% stenosis at the insertion site of the PDA.  EF 50%-55%.  Medical therapy was recommended.  HISTORY OF PRESENT ILLNESS:  An 77 year old male with the above complex problem list.  The patient was admitted to [facility] on September 24 secondary to progressive dyspnea and apparent chest discomfort.  His initial cardiac enzymes were negative though he was noted to have volume overload on exam, was admitted for management of CHF.  The patient was placed on IV diuresis and later found to have elevated troponin up to a peak of 0.51 suggestive of  non-ST-elevation MI.  Decision was made to transfer him to [facility] for evaluation of catheterization.  HOSPITAL  COURSE:  Following arrival to [facility], the patient's volume was felt to be stable enough, performed diagnostic catheterization, which took place on 09/25.  Catheterization revealed significant native three-vessel disease with graft anatomy as outlined above.  There were no obvious culprits for his symptoms.  Continue medical therapy was recommended.  He was transitioned from IV to p.o. Lasix with reduction in weight from 88.9 kg on September 24 to 86 kg this morning.  His renal function has been stable and plan to discharge him home today in good condition.  DISCHARGE LABS:  Hemoglobin 11.1, hematocrit 34.3, WBC 7.2, platelets 165.  Sodium 143, potassium 4.3, chloride 101, CO2 of 35, BUN 12, creatinine 0.87, glucose 126, calcium 9.1, CK 29, MB 3.1, troponin-I 0.51.  TSH 1.724.  DISPOSITION:  The patient will be discharged home today in good condition.  FOLLOWUP PLANS AND APPOINTMENTS:  The patient will follow up in our [facility] office from here forward and scheduled to follow up with Dr. Andee Lineman on October 25 at 3 p.m.  He will follow up with his primary care provider, Dr. Sherryll Burger  in the next 1-2 weeks.  DISCHARGE MEDICATIONS: 1. Lasix 40 mg daily. 2. Potassium chloride 20 mEq daily. 3. Aricept 10 mg at bedtime. 4. Aspirin 81 mg daily. 5. Plavix 75 mg daily. 6. Glucophage 1000 mg b.i.d. to  be resumed on September 27. 7. Imdur 60 mg daily. 8. Levothyroxine 125 mcg daily. 9. Lipitor 80 mg daily. 10.Lisinopril 20 mg daily. 11.Nitroglycerin 0.4 mg p.r.n. chest pain. 12.Toprol-XL 25 mg b.i.d.  OUTSTANDING LAB STUDIES:  None.  DURATION OF DISCHARGE ENCOUNTER:  40 minutes including physician time.     Nicolasa Ducking, ANP   ______________________________ Cassell Clement, M.D.    CB/MEDQ  D:  06/07/2011  T:  06/07/2011  Job:  [id number]  Electronically Signed by Nicolasa Ducking ANP on 06/13/2011 06:09:35 PM Electronically Signed by Cassell Clement M.D. on 06/21/2011  09:00:27 AM

## 2011-06-22 ENCOUNTER — Encounter: Payer: Self-pay | Admitting: Cardiology

## 2011-06-22 ENCOUNTER — Ambulatory Visit (INDEPENDENT_AMBULATORY_CARE_PROVIDER_SITE_OTHER): Payer: Medicare Other | Admitting: Cardiology

## 2011-06-22 VITALS — BP 102/58 | HR 82 | Ht 71.5 in | Wt 193.0 lb

## 2011-06-22 DIAGNOSIS — I4891 Unspecified atrial fibrillation: Secondary | ICD-10-CM

## 2011-06-22 DIAGNOSIS — F0390 Unspecified dementia without behavioral disturbance: Secondary | ICD-10-CM

## 2011-06-22 DIAGNOSIS — R0600 Dyspnea, unspecified: Secondary | ICD-10-CM

## 2011-06-22 DIAGNOSIS — I5020 Unspecified systolic (congestive) heart failure: Secondary | ICD-10-CM

## 2011-06-22 DIAGNOSIS — I259 Chronic ischemic heart disease, unspecified: Secondary | ICD-10-CM

## 2011-06-22 DIAGNOSIS — I480 Paroxysmal atrial fibrillation: Secondary | ICD-10-CM

## 2011-06-22 DIAGNOSIS — E119 Type 2 diabetes mellitus without complications: Secondary | ICD-10-CM

## 2011-06-22 DIAGNOSIS — R0602 Shortness of breath: Secondary | ICD-10-CM

## 2011-06-22 NOTE — Assessment & Plan Note (Signed)
The patient has a history of systolic chronic congestive heart failure.  His ejection fraction is in the range of 40-45%.  He sleeps on one pillow but does have frequent episodes of orthopnea and paroxysmal nocturnal dyspnea.  I have advised that he try raising the head of his bed on bricks or wooden blocks to see if this will cut down on his nocturnal episodes of dyspnea and chest discomfort.

## 2011-06-22 NOTE — Progress Notes (Signed)
[name] [name] Date of Birth:  08/25/1925 [facility] [address]   [address] [place], [place]  [address] [phone]           Fax   [phone]  History of Present Illness: This very pleasant, but mildly demented, 77 year old gentleman is seen in the office as a scheduled followup visit.  His wife brought him to the office.  The patient has a history of known ischemic heart disease.  He had coronary artery bypass graft surgery in 1996.  He had recurrent chest pain in March of 2012 and underwent stenting of the saphenous vein graft to the obtuse marginal.  He was readmitted in September 2012 initially with atrial fibrillation and was not placed on Coumadin because of being a fall risk and multiple comorbidities.  He was readmitted shortly with chest pain and underwent cardiac catheterization on 06/06/11 with Dr. Riley Kill.  There were several potential culprit lesions for his pain, but no obvious culprit lesion.  It was felt best to not intervene, but to continue medical therapy.  The patient has done well since then.  He will occasionally have to use sublingual nitroglycerin at night when he awakens with shortness of breath and orthopnea.  He rarely has any chest discomfort during the day when he is active.  Current Outpatient Prescriptions  Medication Sig Dispense Refill  . aspirin 325 MG tablet Take 81 mg by mouth daily.       . Cinnamon 500 MG capsule Take 500 mg by mouth daily.        [name] donepezil (ARICEPT) 10 MG tablet Take one by mouth daily       . Doxylamine Succinate, Sleep, (UNISOM PO) Take by mouth as needed.       . furosemide (LASIX) 40 MG tablet 40 mg 2 (two) times daily. Taking 40mg  in the am  And 40mg  in the pm       . gabapentin (NEURONTIN) 300 MG capsule Take 300 mg by mouth daily.        [name] glipiZIDE (GLUCOTROL) 5 MG tablet Take one by mouth daily       . isosorbide mononitrate (IMDUR) 30 MG 24 hr tablet Take one by mouth daily       . KLOR-CON M20  20 MEQ tablet Take 20 mEq by mouth daily.       [name] LEVEMIR FLEXPEN 100 UNIT/ML injection Use as directed      . levothyroxine (SYNTHROID, LEVOTHROID) 137 MCG tablet Take 125 mcg by mouth daily.       [name] lisinopril (PRINIVIL,ZESTRIL) 20 MG tablet Take 20 mg by mouth daily.        . metFORMIN (GLUCOPHAGE) 500 MG tablet Take two by mouth twice daily       . metoprolol (TOPROL-XL) 50 MG 24 hr tablet 25 mg. Taking one daily       . nitroGLYCERIN (NITROSTAT) 0.4 MG SL tablet Place 0.4 mg under the tongue every 5 (five) minutes as needed.        Docia Barrier IN Inhale into the lungs. 3L via [place] continuous       . PLAVIX 75 MG tablet Take one by mouth daily       . rosuvastatin (CRESTOR) 20 MG tablet Take 40 mg by mouth daily.         Allergies  Allergen Reactions  . Amiodarone   . Morphine And Related Itching and Rash    Patient Active Problem  List  Diagnoses  . DYSPNEA  . Ischemic cardiomyopathy  . CAD (coronary artery disease)  . Systolic HF (heart failure)  . NSTEMI (non-ST elevated myocardial infarction)  . Status post coronary artery stent placement  . Anemia  . Diabetes mellitus  . Hypothyroidism  . Dementia  . PAF (paroxysmal atrial fibrillation)    History  Smoking status  . Former Smoker  . Quit date: 09/11/1994  Smokeless tobacco  . Not on file    History  Alcohol Use No    No family history on file.  Review of Systems: Constitutional: no fever chills diaphoresis or fatigue or change in weight.  Head and neck: no hearing loss, no epistaxis, no photophobia or visual disturbance. Respiratory: No cough, shortness of breath or wheezing. Cardiovascular: No chest pain peripheral edema, palpitations. Gastrointestinal: No abdominal distention, no abdominal pain, no change in bowel habits hematochezia or melena. Genitourinary: No dysuria, no frequency, no urgency, no nocturia. Musculoskeletal:No arthralgias, no back pain, no gait disturbance or myalgias. Neurological:  No dizziness, no headaches, no numbness, no seizures, no syncope, no weakness, no tremors. Hematologic: No lymphadenopathy, no easy bruising. Psychiatric: No confusion, no hallucinations, no sleep disturbance.    Physical Exam: Filed Vitals:   06/22/11 1028  BP: 102/58  Pulse: 82   General appearance reveals a well-developed, elderly gentleman in no distress.  He is on nasal oxygen.Pupils equal and reactive.   Extraocular Movements are full.  There is no scleral icterus.  The mouth and pharynx are normal.  The neck is supple.  The carotids reveal no bruits.  The jugular venous pressure is normal.  The thyroid is not enlarged.  There is no lymphadenopathy.  The chest is clear to percussion and auscultation. There are no rales or rhonchi. Expansion of the chest is symmetrical.  The precordium is quiet.  The first heart sound is normal.  The second heart sound is physiologically split.  There is no murmur gallop rub or click.  There is no abnormal lift or heave.  The rhythm is regular. The abdomen is soft and nontender. Bowel sounds are normal. The liver and spleen are not enlarged. There Are no abdominal masses. There are no bruits.  The pedal pulses are good.  There is no phlebitis or edema.  There is no cyanosis or clubbing.  Neurologic reveals a evidence of mild confusion    Assessment / Plan:  The patient is to continue same medication.  He and his wife are planning a automobile trip to [place] next week and that should be okay.  He'll be sure to take all of his medicines with him and if he begins to have any swelling.  He will take extra furosemide.  We will plan to have him return in 2 months for a followup visit with Lawson Fiscal and I will get an EKG as a metabolic panel and a CBC at that time.

## 2011-06-22 NOTE — Assessment & Plan Note (Signed)
The patient has a history of diabetes mellitus.  He is not having any hypoglycemic episodes.

## 2011-06-22 NOTE — Patient Instructions (Signed)
continue same dose of medications  Will follow up with Lawson Fiscal NP in 2 months for office visit and labs

## 2011-06-22 NOTE — Cardiovascular Report (Signed)
NAMEMarland Kitchen  [name] [name], [name] [name] NO.:  [id number]  MEDICAL RECORD NO.:  [id number]  LOCATION:  3708                         FACILITY:  [facility]  PHYSICIAN:  Arturo Morton. Riley Kill, MD, FACCDATE OF BIRTH:  1924-10-16  DATE OF PROCEDURE:  06/06/2011 DATE OF DISCHARGE:                           CARDIAC CATHETERIZATION   INDICATIONS:  [name] [name] is a very pleasant gentleman with some progressive dementia according to his wife.  He underwent remote coronary artery bypass graft surgery in 1996 and in March of this year underwent stenting of the saphenous vein graft to the obtuse marginal. He recently presented with atrial fib and was felt to be a poor Coumadin candidate.  He was maintained on dual-antiplatelet therapy.  He was admitted with chest discomfort and then subsequently discharged.  He presents with recurrent chest discomfort at this time.  He had some mild vascular congestion and a small left pleural effusion.  His heart rate was about 110.  He was stabilized and subsequently transferred here.  He has had a drop in his hemoglobin since admission.  His BMP is elevated at 700.  Repeat troponin was 0.46.  PROCEDURES: 1. Left heart catheterization. 2. Selective coronary arteriography. 3. Saphenous vein graft angiography. 4. Selective left internal mammary angiography.  DESCRIPTION OF PROCEDURE:  The patient was brought to the catheterization laboratory and prepped and draped in the usual fashion. Through an anterior puncture, the femoral artery was entered on a first thick.  A 5-French sheath was then placed.  We initially took views of the left coronary artery.  Following this, attempts were made to inject the vein grafts as well as the subclavian.  We used a guiding catheter to inject the subclavian and we were able to get good opacification of this down into the left internal mammary without actually going across a 70% subclavian stenosis, which is irregular.  I elected  not to selectively engage the vessel.  There was some difficulty getting into the previously stented vein graft to the two limbs in the marginals, we used a left bypass graft guiding catheter to get good opacification and we were able to see this relatively well.  Central aortic and left ventricular pressures were measured with pigtail and ventriculography was performed in the RAO projection.  There were no complications.  I then spoke with the family.  I reviewed the films with Dr. Clifton James.  I called Dr. Patty Sermons about the results.  The patient was fairly uncooperative on the table.  He had right shoulder pain starting prior to initiation of the procedure, and that seemed to get worse lying on the table, and he became somewhat uncooperative trying to grab the catheter in the groin.  However, we were able to complete the procedure without any major complications.  HEMODYNAMIC DATA: 1. Central aortic pressure 151/69, mean 102. 2. LV pressure 150/39. 3. On pullback across the aortic valve, there was no obvious gradient;     however, good gradient could not be measured because of ventricular     ectopy.  ANGIOGRAPHIC DATA: 1. On plain fluoroscopy, there is fairly heavily calcification of the     left coronary system. 2. There is a calcified  lesion of about 80% at the distal left main.     There is insignificant vessel in the circumflex as the vessel is     basically subtotally occluded and fills poorly.  The proximal LAD     empties into a diagonal.  The diagonal has an 80% focal stenosis.     The downstream vessel is also somewhat diffusely diseased with at     least at 80% bifurcation stenosis.  The main LAD has disease lying     across the septal perforator, but with evidence of competitive     filling distally. 3. The subclavian has about 70% stenosis as noted previously.  There     is about 40% narrowing at the proximal portion of the internal     mammary, but there is fairly  good filling of the internal mammary     down to the distal LAD.  The distal LAD is noted to have a lot of     diffuse luminal irregularities, but without critical stenoses     noted. 4. The saphenous vein graft had been previously stented to two     branches of the marginals.  The stent is placed proximally, it was     difficult to actually engage the graft in part likely because the     stent hangs out of the ostium.  However, we were able to see the     area pretty well.  It does not appear to be critical, but there is     about 50% to 60% narrowing at the stent site, but it is diffuse and     does not appear to be critical. 5. The right coronary artery is severely diseased proximally and     totally occluded after an RV branch.  The RV branch appears to     partially provide some flow down to the distal right. 6. The saphenous vein graft to the PDA and PLA is intact with TIMI 3     flow.  There is a 70% to 80% lesion that looks like it is over the     area of insertion over the PDA.  The vessel then inserts into the     posterolateral.  The PDA fills retrograde, does have proximal     disease with another branch supplied by this.  Likewise the     posterolateral branch has a proximal 90% stenosis and there is a     second posterolateral branch, which comes off this territory, so     this itself may be a source as well. 7. The ventriculogram demonstrates preserved global systolic function     with an estimated ejection fraction of 50% to 55%.  CONCLUSIONS: 1. Preserved overall left ventricular systolic function with low     normal ejection fraction. 2. Patent internal mammary to the LAD with a known subclavian stenosis     as noted above.  This apparently has a 30 mm gradient. 3. Patent stent to the sequential saphenous vein graft to the OM-1 and     2 with partial in-stent restenosis, 50% to 70% but not critical. 4. Distal left main disease leading into an unprotected diagonal      territory. 5. 70% to 80% saphenous vein graft stenosis in the mid body of the     graft overlying the PDA takeoff. 6. Posterolateral disease, and acute marginal disease that fills by     retrograde filling into the PDA, PLA through  the graft, but is     unprotected and is diffusely diseased.  DISPOSITION:  We have reviewed the films carefully, I have looked at them with Dr. Clifton James.  Troponin leak would not be supported by the appearance of the saphenous vein graft to the OM.  This could eventually re-narrow, but I would not be inclined to open at the present time.  There are multiple potential culprits, the lean would be towards medical therapy given the patient's progressive dementia.  I have spoken with Dr. Patty Sermons and that will be the initial plan at the present time.     Arturo Morton. Riley Kill, MD, [facility]     TDS/MEDQ  D:  06/06/2011  T:  06/06/2011  Job:  [id number]  cc:   Cassell Clement, M.D. CV Laboratory  Electronically Signed by Shawnie Pons MD [facility] on 06/22/2011 05:43:28 AM

## 2011-06-22 NOTE — Assessment & Plan Note (Signed)
Patient has a past history of paroxysmal atrial fibrillation.  He is not on Coumadin because of comorbidities and being a fall risk.  He has not had any TIA symptoms, which are recognizable.  He does have moderately severe dementia.  Recently, his pulse has been regular.  Clinically.  We did not do an EKG today.

## 2011-06-22 NOTE — Assessment & Plan Note (Signed)
The patient has severe dyspnea and is on oxygen therapy 24/7.  He had a remote history of ventricular tachycardia and was on amiodarone for many years and developed pulmonary fibrosis.  He is no longer on amiodarone.  He has had no recurrence of his ventricular tachycardia.

## 2011-06-26 ENCOUNTER — Other Ambulatory Visit: Payer: Self-pay | Admitting: Cardiology

## 2011-07-06 ENCOUNTER — Encounter: Payer: Self-pay | Admitting: Cardiology

## 2011-07-06 ENCOUNTER — Encounter: Payer: Medicare Other | Admitting: Cardiology

## 2011-08-21 ENCOUNTER — Other Ambulatory Visit: Payer: Medicare Other | Admitting: *Deleted

## 2011-08-23 ENCOUNTER — Ambulatory Visit (INDEPENDENT_AMBULATORY_CARE_PROVIDER_SITE_OTHER): Payer: Medicare Other | Admitting: Nurse Practitioner

## 2011-08-23 ENCOUNTER — Encounter: Payer: Self-pay | Admitting: Nurse Practitioner

## 2011-08-23 VITALS — BP 126/58 | HR 54 | Ht 71.5 in | Wt 188.0 lb

## 2011-08-23 DIAGNOSIS — I2589 Other forms of chronic ischemic heart disease: Secondary | ICD-10-CM

## 2011-08-23 DIAGNOSIS — R0602 Shortness of breath: Secondary | ICD-10-CM

## 2011-08-23 DIAGNOSIS — I480 Paroxysmal atrial fibrillation: Secondary | ICD-10-CM

## 2011-08-23 DIAGNOSIS — I255 Ischemic cardiomyopathy: Secondary | ICD-10-CM

## 2011-08-23 DIAGNOSIS — I2510 Atherosclerotic heart disease of native coronary artery without angina pectoris: Secondary | ICD-10-CM

## 2011-08-23 DIAGNOSIS — I4891 Unspecified atrial fibrillation: Secondary | ICD-10-CM

## 2011-08-23 LAB — BASIC METABOLIC PANEL
BUN: 29 mg/dL — ABNORMAL HIGH (ref 6–23)
CO2: 35 mEq/L — ABNORMAL HIGH (ref 19–32)
Calcium: 9.5 mg/dL (ref 8.4–10.5)
Chloride: 97 mEq/L (ref 96–112)
Creatinine, Ser: 1.6 mg/dL — ABNORMAL HIGH (ref 0.4–1.5)
GFR: 44.56 mL/min — ABNORMAL LOW (ref >60.00)
Glucose, Bld: 190 mg/dL — ABNORMAL HIGH (ref 70–99)
Potassium: 5.3 mEq/L — ABNORMAL HIGH (ref 3.5–5.1)
Sodium: 140 mEq/L (ref 135–145)

## 2011-08-23 LAB — CBC WITH DIFFERENTIAL/PLATELET
Basophils Absolute: 0 10*3/uL (ref 0.0–0.1)
Basophils Relative: 0.3 % (ref 0.0–3.0)
Eosinophils Absolute: 0.3 10*3/uL (ref 0.0–0.7)
Eosinophils Relative: 2.5 % (ref 0.0–5.0)
HCT: 41.3 % (ref 39.0–52.0)
Hemoglobin: 13.8 g/dL (ref 13.0–17.0)
Lymphocytes Relative: 14.9 % (ref 12.0–46.0)
Lymphs Abs: 1.5 10*3/uL (ref 0.7–4.0)
MCHC: 33.5 g/dL (ref 30.0–36.0)
MCV: 85.4 fl (ref 78.0–100.0)
Monocytes Absolute: 0.6 10*3/uL (ref 0.1–1.0)
Monocytes Relative: 6.1 % (ref 3.0–12.0)
Neutro Abs: 7.8 10*3/uL — ABNORMAL HIGH (ref 1.4–7.7)
Neutrophils Relative %: 76.2 % (ref 43.0–77.0)
Platelets: 229 10*3/uL (ref 150.0–400.0)
RBC: 4.84 Mil/uL (ref 4.22–5.81)
RDW: 15.3 % — ABNORMAL HIGH (ref 11.5–14.6)
WBC: 10.2 10*3/uL (ref 4.5–10.5)

## 2011-08-23 NOTE — Assessment & Plan Note (Signed)
He seems to be doing pretty good and actually looks better than his wife. He is more active. Has oxygen in place. We will check his labs today. I will have him see Dr. Patty Sermons in 2 months for follow up. Call the [facility] office at [phone] if you have any questions, problems or concerns.

## 2011-08-23 NOTE — Patient Instructions (Signed)
We are going to check some labs today.  Stay on your current medicines.  We will see you back in 2 months.  Call the [facility] office at [phone] if you have any questions, problems or concerns.

## 2011-08-23 NOTE — Assessment & Plan Note (Signed)
He is in atrial fib by physical exam today. Does not appear symptomatic. Not a candidate for coumadin. His rate is controlled. I have left him on his current medicines. We will be checking his labs today. We will see him back in 2 months.

## 2011-08-23 NOTE — Progress Notes (Signed)
[name] Date of Birth: [birth year] Medical Record #[id number]  History of Present Illness: [name] [name] is seen today for a 2 month check. He is seen for Dr. Patty Sermons. He is 77 years old. He is pleasantly demented. He has known ischemic heart disease with remote CABG in 1996. He had a stent to the SVG to the OM back in March of 2012. He does have a history of PAF. He is not on coumadin due to falls and multiple comorbidities. He has been readmitted back in September and had repeat cath with Dr. Riley Kill. There were several potential culprit lesions for his chest pain, but no obvious culprit lesions. He was felt to best be managed medically. EF is 40-45%.   He comes in today. He is here with his wife, she provides most of the history. He is doing well. No recent falls. He is walking to the dining room so she can use his scooter. He is down about 5 pounds of weight. Appetite is ok. Remains on his oxygen. No chest pain. She feels like he is doing pretty good. No problems with his medicines.   Current Outpatient Prescriptions on File Prior to Visit  Medication Sig Dispense Refill  . Cinnamon 500 MG capsule Take 500 mg by mouth daily.        [name] donepezil (ARICEPT) 10 MG tablet Take one by mouth daily       . Doxylamine Succinate, Sleep, (UNISOM PO) Take by mouth as needed.       . furosemide (LASIX) 40 MG tablet TAKE ONE TABLET BY MOUTH TWICE DAILY  180 tablet  3  . gabapentin (NEURONTIN) 300 MG capsule Take 300 mg by mouth daily.        [name] glipiZIDE (GLUCOTROL) 5 MG tablet Take one by mouth daily       . isosorbide mononitrate (IMDUR) 30 MG 24 hr tablet Take one by mouth daily       . KLOR-CON M20 20 MEQ tablet Take 20 mEq by mouth daily.       [name] LEVEMIR FLEXPEN 100 UNIT/ML injection Use as directed      . levothyroxine (SYNTHROID, LEVOTHROID) 137 MCG tablet Take 125 mcg by mouth daily.       [name] lisinopril (PRINIVIL,ZESTRIL) 20 MG tablet Take 20 mg by mouth daily.        . metFORMIN (GLUCOPHAGE) 500  MG tablet Take two by mouth twice daily       . nitroGLYCERIN (NITROSTAT) 0.4 MG SL tablet Place 0.4 mg under the tongue every 5 (five) minutes as needed.        Docia Barrier IN Inhale into the lungs. 3L via [place] continuous       . PLAVIX 75 MG tablet Take one by mouth daily       . rosuvastatin (CRESTOR) 20 MG tablet Take 20 mg by mouth daily.       [name] DISCONTD: aspirin 325 MG tablet Take 81 mg by mouth daily.       [name] DISCONTD: metoprolol (TOPROL-XL) 50 MG 24 hr tablet 25 mg. Taking one daily         Allergies  Allergen Reactions  . Amiodarone   . Morphine And Related Itching and Rash    Past Medical History  Diagnosis Date  . Ischemic cardiomyopathy     EF 40 to 45%  . CAD (coronary artery disease)     with remote CABG in 1996, s/p PCI to SVG to OM  in March 2012; repeat cath in September 2012 - managed medically  . Hx of CABG   . Hyperlipidemia   . V-tach     Previously on amiodarone  . Pulmonary fibrosis     Due to amiodarone  . PVD (peripheral vascular disease)     R CEA  . Hypothyroidism   . Insulin dependent diabetes mellitus   . H/O: lung cancer 2006    s/p resection and lymph node dissection  . Dementia   . Systolic HF (heart failure)   . NSTEMI (non-ST elevated myocardial infarction) 2012  . Supplemental oxygen dependent   . Status post coronary artery stent placement 11/2010    DES to SVG to OM  . PAF (paroxysmal atrial fibrillation)     Not a candidate for coumadin therapy    Past Surgical History  Procedure Date  . Coronary artery bypass graft 1996    x 5  . Des stent to vein graft to om March 2012  . Right cea   . Cardiac catheterization March 2012    History  Smoking status  . Former Smoker  . Quit date: 09/11/1994  Smokeless tobacco  . Not on file    History  Alcohol Use No    History reviewed. No pertinent family history.  Review of Systems: The review of systems is positive for unsteadiness. He does have chronic dyspnea from pulmonary  fibrosis/amio therapy and is on continuous oxygen therapy.  He has lost some weight. No falls. All other systems were reviewed and are negative.  Physical Exam: BP 126/58  Pulse 54  Ht 5' 11.5" (1.816 m)  Wt 188 lb (85.276 kg)  BMI 25.86 kg/m2 Patient is an elderly male who is demented but in no acute distress. Skin is warm and dry. Color is normal.  HEENT is unremarkable. Normocephalic/atraumatic. PERRL. Sclera are nonicteric. Neck is supple. No masses. No JVD. Lungs are clear. Oxygen is in place. Cardiac exam shows an irregular rhythm. Rate is controlled. Abdomen is soft. Extremities are without edema. Gait and ROM are intact. No gross neurologic deficits noted.   LABORATORY DATA: PENDING   Assessment / Plan:

## 2011-08-23 NOTE — Assessment & Plan Note (Signed)
Seems to be doing ok. Continues to use his oxygen.

## 2011-08-24 ENCOUNTER — Telehealth: Payer: Self-pay | Admitting: *Deleted

## 2011-08-24 DIAGNOSIS — E876 Hypokalemia: Secondary | ICD-10-CM

## 2011-08-24 NOTE — Telephone Encounter (Signed)
Advised wife  

## 2011-08-24 NOTE — Telephone Encounter (Signed)
Message copied by Burnell Blanks on Thu Aug 24, 2011  1:53 PM ------      Message from: Rosalio Macadamia      Created: Wed Aug 23, 2011  4:05 PM       Ok to report. Labs are satisfactory except his renal function is a little worse. Can we get this rechecked next week? If remains up, will need to cut back the lasix.

## 2011-08-30 ENCOUNTER — Other Ambulatory Visit (INDEPENDENT_AMBULATORY_CARE_PROVIDER_SITE_OTHER): Payer: Medicare Other | Admitting: *Deleted

## 2011-08-30 DIAGNOSIS — E876 Hypokalemia: Secondary | ICD-10-CM

## 2011-08-30 LAB — BASIC METABOLIC PANEL
Calcium: 9.3 mg/dL (ref 8.4–10.5)
Creatinine, Ser: 1.5 mg/dL (ref 0.4–1.5)

## 2011-09-01 ENCOUNTER — Telehealth: Payer: Self-pay | Admitting: *Deleted

## 2011-09-01 NOTE — Telephone Encounter (Signed)
Message copied by Burnell Blanks on Fri Sep 01, 2011  4:46 PM ------      Message from: Cassell Clement      Created: Thu Aug 31, 2011  1:23 PM       .Please report.  The labs are stable.  Continue same meds.  Continue careful diet.  Drink plenty of water.

## 2011-09-01 NOTE — Telephone Encounter (Signed)
Advised wife of labs 

## 2011-09-23 DIAGNOSIS — I214 Non-ST elevation (NSTEMI) myocardial infarction: Secondary | ICD-10-CM

## 2011-09-24 ENCOUNTER — Inpatient Hospital Stay (HOSPITAL_COMMUNITY)
Admission: AD | Admit: 2011-09-24 | Discharge: 2011-09-29 | DRG: 280 | Disposition: A | Discharge status: Skilled Nursing Facility | Payer: Medicare Other | Source: Other Acute Inpatient Hospital | Attending: Internal Medicine | Admitting: Internal Medicine

## 2011-09-24 ENCOUNTER — Inpatient Hospital Stay (HOSPITAL_COMMUNITY): Payer: Medicare Other

## 2011-09-24 DIAGNOSIS — I214 Non-ST elevation (NSTEMI) myocardial infarction: Secondary | ICD-10-CM

## 2011-09-24 DIAGNOSIS — I2510 Atherosclerotic heart disease of native coronary artery without angina pectoris: Secondary | ICD-10-CM

## 2011-09-24 DIAGNOSIS — I252 Old myocardial infarction: Secondary | ICD-10-CM

## 2011-09-24 DIAGNOSIS — G309 Alzheimer's disease, unspecified: Secondary | ICD-10-CM | POA: Diagnosis present

## 2011-09-24 DIAGNOSIS — J8410 Pulmonary fibrosis, unspecified: Secondary | ICD-10-CM | POA: Diagnosis present

## 2011-09-24 DIAGNOSIS — R339 Retention of urine, unspecified: Secondary | ICD-10-CM

## 2011-09-24 DIAGNOSIS — R5381 Other malaise: Secondary | ICD-10-CM | POA: Diagnosis not present

## 2011-09-24 DIAGNOSIS — Z7902 Long term (current) use of antithrombotics/antiplatelets: Secondary | ICD-10-CM

## 2011-09-24 DIAGNOSIS — I255 Ischemic cardiomyopathy: Secondary | ICD-10-CM

## 2011-09-24 DIAGNOSIS — Z85118 Personal history of other malignant neoplasm of bronchus and lung: Secondary | ICD-10-CM

## 2011-09-24 DIAGNOSIS — N401 Enlarged prostate with lower urinary tract symptoms: Secondary | ICD-10-CM | POA: Diagnosis present

## 2011-09-24 DIAGNOSIS — Z9861 Coronary angioplasty status: Secondary | ICD-10-CM

## 2011-09-24 DIAGNOSIS — E785 Hyperlipidemia, unspecified: Secondary | ICD-10-CM | POA: Diagnosis present

## 2011-09-24 DIAGNOSIS — Z79899 Other long term (current) drug therapy: Secondary | ICD-10-CM

## 2011-09-24 DIAGNOSIS — R0602 Shortness of breath: Secondary | ICD-10-CM

## 2011-09-24 DIAGNOSIS — I2589 Other forms of chronic ischemic heart disease: Secondary | ICD-10-CM | POA: Diagnosis present

## 2011-09-24 DIAGNOSIS — F0390 Unspecified dementia without behavioral disturbance: Secondary | ICD-10-CM

## 2011-09-24 DIAGNOSIS — F0280 Dementia in other diseases classified elsewhere without behavioral disturbance: Secondary | ICD-10-CM | POA: Diagnosis present

## 2011-09-24 DIAGNOSIS — T462X5A Adverse effect of other antidysrhythmic drugs, initial encounter: Secondary | ICD-10-CM | POA: Diagnosis present

## 2011-09-24 DIAGNOSIS — Z955 Presence of coronary angioplasty implant and graft: Secondary | ICD-10-CM

## 2011-09-24 DIAGNOSIS — I5023 Acute on chronic systolic (congestive) heart failure: Secondary | ICD-10-CM | POA: Diagnosis present

## 2011-09-24 DIAGNOSIS — I509 Heart failure, unspecified: Secondary | ICD-10-CM | POA: Diagnosis present

## 2011-09-24 DIAGNOSIS — Z7982 Long term (current) use of aspirin: Secondary | ICD-10-CM

## 2011-09-24 DIAGNOSIS — I4891 Unspecified atrial fibrillation: Secondary | ICD-10-CM | POA: Diagnosis present

## 2011-09-24 DIAGNOSIS — E039 Hypothyroidism, unspecified: Secondary | ICD-10-CM

## 2011-09-24 DIAGNOSIS — I480 Paroxysmal atrial fibrillation: Secondary | ICD-10-CM

## 2011-09-24 DIAGNOSIS — I25810 Atherosclerosis of coronary artery bypass graft(s) without angina pectoris: Secondary | ICD-10-CM | POA: Diagnosis present

## 2011-09-24 DIAGNOSIS — A419 Sepsis, unspecified organism: Secondary | ICD-10-CM

## 2011-09-24 DIAGNOSIS — N12 Tubulo-interstitial nephritis, not specified as acute or chronic: Secondary | ICD-10-CM

## 2011-09-24 DIAGNOSIS — Z9981 Dependence on supplemental oxygen: Secondary | ICD-10-CM

## 2011-09-24 DIAGNOSIS — I5020 Unspecified systolic (congestive) heart failure: Secondary | ICD-10-CM

## 2011-09-24 DIAGNOSIS — I739 Peripheral vascular disease, unspecified: Secondary | ICD-10-CM | POA: Diagnosis present

## 2011-09-24 DIAGNOSIS — N138 Other obstructive and reflux uropathy: Secondary | ICD-10-CM | POA: Diagnosis present

## 2011-09-24 DIAGNOSIS — D649 Anemia, unspecified: Secondary | ICD-10-CM

## 2011-09-24 DIAGNOSIS — E119 Type 2 diabetes mellitus without complications: Secondary | ICD-10-CM | POA: Diagnosis present

## 2011-09-24 HISTORY — DX: Shortness of breath: R06.02

## 2011-09-24 HISTORY — DX: Angina pectoris, unspecified: I20.9

## 2011-09-24 LAB — CBC
Platelets: 137 10*3/uL — ABNORMAL LOW (ref 150–400)
RDW: 15.1 % (ref 11.5–15.5)
WBC: 8.5 10*3/uL (ref 4.0–10.5)

## 2011-09-24 LAB — COMPREHENSIVE METABOLIC PANEL
Albumin: 2.6 g/dL — ABNORMAL LOW (ref 3.5–5.2)
Alkaline Phosphatase: 130 U/L — ABNORMAL HIGH (ref 39–117)
BUN: 25 mg/dL — ABNORMAL HIGH (ref 6–23)
Creatinine, Ser: 1.1 mg/dL (ref 0.50–1.35)
Potassium: 4.9 mEq/L (ref 3.5–5.1)
Total Protein: 6.1 g/dL (ref 6.0–8.3)

## 2011-09-24 LAB — URINE CULTURE
Colony Count: NO GROWTH
Culture  Setup Time: 201301140157

## 2011-09-24 LAB — DIFFERENTIAL
Basophils Absolute: 0 10*3/uL (ref 0.0–0.1)
Lymphocytes Relative: 19 % (ref 12–46)
Neutro Abs: 6.1 10*3/uL (ref 1.7–7.7)

## 2011-09-24 LAB — MAGNESIUM: Magnesium: 2.6 mg/dL — ABNORMAL HIGH (ref 1.5–2.5)

## 2011-09-24 LAB — CARDIAC PANEL(CRET KIN+CKTOT+MB+TROPI): Relative Index: INVALID (ref 0.0–2.5)

## 2011-09-24 LAB — CULTURE, BLOOD (ROUTINE X 2)
Culture  Setup Time: 201301140135
Culture: NO GROWTH

## 2011-09-24 LAB — LIPASE, BLOOD: Lipase: 37 U/L (ref 11–59)

## 2011-09-24 LAB — PRO B NATRIURETIC PEPTIDE: Pro B Natriuretic peptide (BNP): 11492 pg/mL — ABNORMAL HIGH (ref 0–450)

## 2011-09-24 LAB — GLUCOSE, CAPILLARY: Glucose-Capillary: 136 mg/dL — ABNORMAL HIGH (ref 70–99)

## 2011-09-24 MED ORDER — NITROGLYCERIN 0.4 MG SL SUBL
0.4000 mg | SUBLINGUAL_TABLET | SUBLINGUAL | Status: DC | PRN
Start: 2011-09-24 — End: 2011-09-29

## 2011-09-24 MED ORDER — CLOPIDOGREL BISULFATE 75 MG PO TABS
75.0000 mg | ORAL_TABLET | Freq: Every day | ORAL | Status: DC
  Administered 2011-09-25 – 2011-09-29 (×5): 75 mg via ORAL
  Filled 2011-09-24 (×7): qty 1

## 2011-09-24 MED ORDER — LISINOPRIL 20 MG PO TABS
20.0000 mg | ORAL_TABLET | Freq: Every day | ORAL | Status: DC
  Administered 2011-09-24 – 2011-09-29 (×6): 20 mg via ORAL
  Filled 2011-09-24 (×6): qty 1

## 2011-09-24 MED ORDER — DONEPEZIL HCL 5 MG PO TABS
5.0000 mg | ORAL_TABLET | Freq: Every day | ORAL | Status: DC
  Administered 2011-09-24 – 2011-09-28 (×5): 5 mg via ORAL
  Filled 2011-09-24 (×6): qty 1

## 2011-09-24 MED ORDER — CARBIDOPA-LEVODOPA CR 25-100 MG PO TBCR
1.0000 | EXTENDED_RELEASE_TABLET | Freq: Two times a day (BID) | ORAL | Status: DC
  Administered 2011-09-25 – 2011-09-29 (×9): 1 via ORAL
  Filled 2011-09-24 (×12): qty 1

## 2011-09-24 MED ORDER — FUROSEMIDE 10 MG/ML IJ SOLN
40.0000 mg | Freq: Two times a day (BID) | INTRAMUSCULAR | Status: DC
  Administered 2011-09-24: 40 mg via INTRAVENOUS
  Filled 2011-09-24 (×3): qty 4

## 2011-09-24 MED ORDER — METOPROLOL SUCCINATE ER 25 MG PO TB24
25.0000 mg | ORAL_TABLET | Freq: Every day | ORAL | Status: DC
  Administered 2011-09-24 – 2011-09-29 (×6): 25 mg via ORAL
  Filled 2011-09-24 (×6): qty 1

## 2011-09-24 MED ORDER — ASPIRIN 325 MG PO TABS
325.0000 mg | ORAL_TABLET | Freq: Every day | ORAL | Status: DC
  Administered 2011-09-25 – 2011-09-29 (×5): 325 mg via ORAL
  Filled 2011-09-24 (×5): qty 1

## 2011-09-24 MED ORDER — SIMVASTATIN 40 MG PO TABS
40.0000 mg | ORAL_TABLET | Freq: Every day | ORAL | Status: DC
  Filled 2011-09-24: qty 1

## 2011-09-24 MED ORDER — SODIUM CHLORIDE 0.9 % IV SOLN: 250.0000 mL | INTRAVENOUS | Status: DC | PRN

## 2011-09-24 MED ORDER — CIPROFLOXACIN HCL 500 MG PO TABS
500.0000 mg | ORAL_TABLET | Freq: Two times a day (BID) | ORAL | Status: AC
  Administered 2011-09-24 – 2011-09-29 (×10): 500 mg via ORAL
  Filled 2011-09-24 (×10): qty 1

## 2011-09-24 MED ORDER — INSULIN ASPART 100 UNIT/ML ~~LOC~~ SOLN
0.0000 [IU] | Freq: Three times a day (TID) | SUBCUTANEOUS | Status: DC
  Filled 2011-09-24: qty 3

## 2011-09-24 MED ORDER — ASPIRIN 81 MG PO CHEW
81.0000 mg | CHEWABLE_TABLET | ORAL | Status: AC
  Administered 2011-09-24: 81 mg via ORAL
  Filled 2011-09-24: qty 1

## 2011-09-24 MED ORDER — HEPARIN SOD (PORCINE) IN D5W 100 UNIT/ML IV SOLN
1350.0000 [IU]/h | INTRAVENOUS | Status: DC
  Administered 2011-09-24: 1000 [IU]/h via INTRAVENOUS
  Administered 2011-09-26: 1350 [IU]/h via INTRAVENOUS
  Filled 2011-09-24 (×4): qty 250

## 2011-09-24 MED ORDER — CLOPIDOGREL BISULFATE 300 MG PO TABS
300.0000 mg | ORAL_TABLET | Freq: Once | ORAL | Status: AC
  Administered 2011-09-25: 300 mg via ORAL
  Filled 2011-09-24: qty 1

## 2011-09-24 MED ORDER — LEVOTHYROXINE SODIUM 125 MCG PO TABS
125.0000 ug | ORAL_TABLET | Freq: Every day | ORAL | Status: DC
  Administered 2011-09-25 – 2011-09-29 (×5): 125 ug via ORAL
  Filled 2011-09-24 (×7): qty 1

## 2011-09-24 MED ORDER — HEPARIN SODIUM (PORCINE) 5000 UNIT/ML IJ SOLN: 5000.0000 [IU] | Freq: Three times a day (TID) | INTRAMUSCULAR | Status: DC

## 2011-09-24 MED ORDER — ASPIRIN 300 MG RE SUPP
300.0000 mg | RECTAL | Status: DC
  Filled 2011-09-24: qty 1

## 2011-09-24 NOTE — Progress Notes (Signed)
ANTICOAGULATION CONSULT NOTE - Initial Consult  Pharmacy Consult for heparin Indication: chest pain/ACS  Allergies  Allergen Reactions  . Amiodarone Rash  . Morphine And Related Itching and Rash    Patient Measurements:   Adjusted Body Weight: approximately 83 kg  Vital Signs: Temp: 98.2 F (36.8 C) (01/13 1942) Temp src: Oral (01/13 1942) BP: 119/81 mmHg (01/13 1900) Pulse Rate: 89  (01/13 1900)  Labs: No results found for this basename: HGB:2,HCT:3,PLT:3,APTT:3,LABPROT:3,INR:3,HEPARINUNFRC:3,CREATININE:3,CKTOTAL:3,CKMB:3,TROPONINI:3 in the last 72 hours The CrCl is unknown because both a height and weight (above a minimum accepted value) are required for this calculation.  Medical History: Past Medical History  Diagnosis Date  . Ischemic cardiomyopathy     EF 40 to 45%  . CAD (coronary artery disease)     with remote CABG in 1996, s/p PCI to SVG to OM in March 2012; repeat cath in September 2012 - managed medically  . Hx of CABG   . Hyperlipidemia   . V-tach     Previously on amiodarone  . Pulmonary fibrosis     Due to amiodarone  . PVD (peripheral vascular disease)     R CEA  . Hypothyroidism   . Insulin dependent diabetes mellitus   . H/O: lung cancer 2006    s/p resection and lymph node dissection  . Dementia   . Systolic HF (heart failure)   . NSTEMI (non-ST elevated myocardial infarction) 2012  . Supplemental oxygen dependent   . Status post coronary artery stent placement 11/2010    DES to SVG to OM  . PAF (paroxysmal atrial fibrillation)     Not a candidate for coumadin therapy    Medications:  Prescriptions prior to admission  Medication Sig Dispense Refill  . aspirin 81 MG tablet Take 81 mg by mouth daily.        . Cinnamon 500 MG capsule Take 500 mg by mouth daily.        [name] donepezil (ARICEPT) 10 MG tablet Take one by mouth daily       . furosemide (LASIX) 40 MG tablet TAKE ONE TABLET BY MOUTH TWICE DAILY  180 tablet  3  . gabapentin  (NEURONTIN) 300 MG capsule Take 300 mg by mouth daily.        [name] glipiZIDE (GLUCOTROL) 5 MG tablet Take 5 mg by mouth daily. Take one by mouth daily       . isosorbide mononitrate (IMDUR) 30 MG 24 hr tablet Take 30 mg by mouth daily. Take one by mouth daily       . KLOR-CON M20 20 MEQ tablet Take 20 mEq by mouth daily.       [name] levothyroxine (SYNTHROID, LEVOTHROID) 137 MCG tablet Take 125 mcg by mouth daily.       [name] lisinopril (PRINIVIL,ZESTRIL) 20 MG tablet Take 20 mg by mouth daily.        . metFORMIN (GLUCOPHAGE) 500 MG tablet Take two by mouth twice daily       . metoprolol succinate (TOPROL-XL) 25 MG 24 hr tablet Take 25 mg by mouth daily.        . nitroGLYCERIN (NITROSTAT) 0.4 MG SL tablet Place 0.4 mg under the tongue every 5 (five) minutes as needed. For chest pain      . PLAVIX 75 MG tablet Take 75 mg by mouth daily. Take one by mouth daily       . rosuvastatin (CRESTOR) 20 MG tablet Take 20 mg by mouth daily.       [name]  OXYGEN-HELIUM IN Inhale into the lungs. 3L via [place] continuous         Assessment: 78 yo man [facility] from [facility] with elevated troponins to start heparin for r/o ACS.[name] He received lovenox ~1mg /kg at 13:00 today and he is in ARF with SrCr 1.6 and Crcl ~ 40. Goal of Therapy:  Heparin level 0.3-0.7 units/ml   Plan:  Will start heparin at 1000 units/hr at 22:00.  Check heparin level and CBC 8 hours after start and daily while on heparin.  Talbert Cage Poteet 09/24/2011,7:56 PM

## 2011-09-24 NOTE — H&P (Signed)
Name: [name] [name] MRN: [id number] DOB: 1925-08-10    LOS: 0  PCCM ADMISSION NOTE  History of Present Illness: 78 y/o pleasant man with a Hx of Alzheimer disease and prostatism, presented to [facility] hospital on 1/13 for urinary retention. Was treated in ED with Foley Cath insertion and discharged. However, the patient returned to ED on 09/23/11 with acute abdominal pain. At that time, the patient was found to have "dirty" urine and pyelonephritic straining per CT of abdomen and was admitted to [facility]. Subsequently,  he developed CP with an increase in troponin from 0.5 up to 5. Cardiology consult started the patient on a heparin gtt for NSTEMI. The patient was transferred to [facility] under PCCM care on 09/24/11. Currently,  The patient is CP-free and denies any other concerns.  Lines / Drains: Right peripheral IV 1/12>>  Cultures:   Antibiotics: Bactrim DS 1/12>>1/13 Cipro 1/13>>  Tests / Events: CXR 1/13 >>?infiltrate EKG 1/13 ->>PVC's 2-D ECHO 1/14 >>       Past Medical History  Diagnosis Date  . Ischemic cardiomyopathy     EF 40 to 45%  . CAD (coronary artery disease)     with remote CABG in 1996, s/p PCI to SVG to OM in March 2012; repeat cath in September 2012 - managed medically  . Hx of CABG   . Hyperlipidemia   . V-tach     Previously on amiodarone  . Pulmonary fibrosis     Due to amiodarone  . PVD (peripheral vascular disease)     R CEA  . Hypothyroidism   . Insulin dependent diabetes mellitus   . H/O: lung cancer 2006    s/p resection and lymph node dissection  . Dementia   . Systolic HF (heart failure)   . NSTEMI (non-ST elevated myocardial infarction) 2012  . Supplemental oxygen dependent   . Status post coronary artery stent placement 11/2010    DES to SVG to OM  . PAF (paroxysmal atrial fibrillation)     Not a candidate for coumadin therapy   Past Surgical History  Procedure Date  . Coronary artery bypass graft 1996    x 5  . Des stent  to vein graft to om March 2012  . Right cea   . Cardiac catheterization March 2012   Prior to Admission medications   Medication Sig Start Date End Date Taking? Authorizing Provider  aspirin 81 MG tablet Take 81 mg by mouth daily.     Yes Historical Provider, MD  Cinnamon 500 MG capsule Take 500 mg by mouth daily.     Yes Historical Provider, MD  donepezil (ARICEPT) 10 MG tablet Take one by mouth daily  10/18/10  Yes Historical Provider, MD  furosemide (LASIX) 40 MG tablet TAKE ONE TABLET BY MOUTH TWICE DAILY 06/26/11  Yes Cassell Clement, MD  gabapentin (NEURONTIN) 300 MG capsule Take 300 mg by mouth daily.     Yes Historical Provider, MD  glipiZIDE (GLUCOTROL) 5 MG tablet Take 5 mg by mouth daily. Take one by mouth daily  11/09/10  Yes Historical Provider, MD  isosorbide mononitrate (IMDUR) 30 MG 24 hr tablet Take 30 mg by mouth daily. Take one by mouth daily  08/26/10  Yes Historical Provider, MD  KLOR-CON M20 20 MEQ tablet Take 20 mEq by mouth daily.  06/08/11  Yes Historical Provider, MD  levothyroxine (SYNTHROID, LEVOTHROID) 137 MCG tablet Take 125 mcg by mouth daily.    Yes Historical Provider, MD  lisinopril (PRINIVIL,ZESTRIL)  20 MG tablet Take 20 mg by mouth daily.     Yes Historical Provider, MD  metFORMIN (GLUCOPHAGE) 500 MG tablet Take two by mouth twice daily  10/18/10  Yes Historical Provider, MD  metoprolol succinate (TOPROL-XL) 25 MG 24 hr tablet Take 25 mg by mouth daily.     Yes Historical Provider, MD  nitroGLYCERIN (NITROSTAT) 0.4 MG SL tablet Place 0.4 mg under the tongue every 5 (five) minutes as needed. For chest pain   Yes Historical Provider, MD  PLAVIX 75 MG tablet Take 75 mg by mouth daily. Take one by mouth daily  11/26/10  Yes Historical Provider, MD  rosuvastatin (CRESTOR) 20 MG tablet Take 20 mg by mouth daily.    Yes Historical Provider, MD  OXYGEN-HELIUM IN Inhale into the lungs. 3L via [place] continuous     Historical Provider, MD   Allergies Allergies  Allergen  Reactions  . Amiodarone Rash  . Morphine And Related Itching and Rash    Family History No family history on file.  Social History  reports that he quit smoking about 17 years ago. He does not have any smokeless tobacco history on file. He reports that he does not drink alcohol or use illicit drugs.  Review Of Systems  11 points review of systems is negative with an exception of listed in HPI.  Vital Signs: Temp:  [98.2 F (36.8 C)-98.7 F (37.1 C)] 98.2 F (36.8 C) (01/13 1942) Pulse Rate:  [69-93] 69  (01/13 2200) Resp:  [21-24] 24  (01/13 2200) BP: (107-148)/(45-81) 107/71 mmHg (01/13 2200) SpO2:  [88 %-99 %] 88 % (01/13 2200) I/O last 3 completed shifts: In: 40 [I.V.:40] Out: -   Physical Examination: General:  Alert and oriented x1 (person) Neuro: Grossly intact; no focal deficits  HEENT:  No scleral icterus or injection; oropharynx with moist buccal mucosa. Neck:  Supple, no JVD's no bruits; no masses  Cardiovascular:  RRR, systolic EM grade 1/6 best heard at LUSB;  Lungs:  CTA Abdomen:  BS present; non distended; non tender; no guarding, no HSM Musculoskeletal:  Without joint deformity; erythema, effusion; moves all extremities Skin:  No lesions or cyanosis. Right arm peripheral line without infiltration.   Labs and Imaging:  CXR, EKG and labs pending Assessment and Plan:  PULMONARY: Hx of ?pulmonary fibrosis, oxygen dependent. Currently is in NAD; w/o tachypnea and pulse Ox 100%  ? PNA per transfer note -CXR now  CARDIOVASCULAR: NSTEMI per transfer note with troponin elevation of 5. Hx of CAD sp CABG > 5 years ago. Class II per [place] heart association classification. Stage C per [employer] [facility] -will cycle CE -repeat ECG -heparin gtt per Cards recs from [facility] hospital -restart metoprolol, lisinopril, plavix, ASA -will consult Cards ->Cath? -NPO p midnight  Systolic CHF  Exacerbation due to ?cardiac ischemia/NSTEMI. Old Echo with EF of  45% per recs -elevated pro-BNP -start lasix IV -restart ACEI and beta-blocker -IVF KVO -fluid restriction -follow UO -> goal: neg net balance  Lab 09/24/11 1957  LATICACIDVEN 1.1    RENAL: Apparently AKD with Cr of 1.6 at [facility]. Creatinine from 1/13 is back to 1.1. Patient is making urine. Will follow b-mets and UO Start lasix IV and follow lytes  Lab 09/24/11 1952  NA 138  K 4.9  CL 103  CO2 26  GLUCOSE 117*  BUN 25*  CREATININE 1.10  CALCIUM 8.4  MG 2.6*  PHOS --    GASTROINTESTINAL: -NPO post midnight (?Cath angio in  am)  Lab 09/24/11 1952  AST 44*  ALT 45  ALKPHOS 130*  BILITOT 0.3  PROT 6.1  ALBUMIN 2.6*     HEMATOLOGIC: -H:H stable -no signs of bleed -follow CBC -INR  Lab 09/24/11 1952  HCT 34.1*   No results found for this basename: INR:5 in the last 168 hours No results found for this basename: APTT:5 in the last 168 hours  INFECTIOUS: UTI  -received two doses of Bactrim in  [place]. -Start Cipro  ? PNA per transfer notes from [place]. -patient is without fever and leukocytosis, no cough. However he is with very high PORT score (>110). -will repeat CXR ->if infiltrate, would need to broaden ABX spectrum.  Lab 09/24/11 1952  WBC 8.5   No results found for this basename: PROCALCITON:5 in the last 168 hours   ENDOCRINE: Hypothyroidism -recheck TSH Continue with home dose of Synthroid at 125 mcg PO  DM, type 2 -CBG's -hold metformin (?cath in am) -SSI  Lab 09/24/11 2116 09/24/11 1846  GLUCAP 136* 129*    NEUROLOGIC: Hx of alzheimer disease, stable, no sundowning -continue with aricept   BEST PRACTICE / DISPOSITION: -->  ICU status under PCCM -->  Full code -->  NPO -->  Heparin [place] for DVT Px -->  Protonix IV for GI Px -->  Ventilator bundle -->  Family updated at bedside  Best practices / Disposition: -->ICU status under PCCM -->full code but needs to be verified -->Heparin for DVT Px -->diet -->family  updated at bedside   KARIMOVA,NODIRA 09/24/2011, 10:42 PM   This patient was seen by Dr Tyson Alias who oversaw the care provided by Dr Denton Meek as reflected in the note above. I am signing this note for Dr Merril Abbe, MD;  PCCM service; Mobile [phone]

## 2011-09-24 NOTE — Consult Note (Signed)
Cardiology Consult Note: Patient seen earlier today by [facility] 2 Cardiology moonlighter at [facility] (Dr. Cam Hai).  He was transferred to [facility] for management of a NSTEMI.    For details, please see Dr. Roxy Manns full consult note.  Briefly, 74 YOWM with PMH of ischemic CM with EF 40-45%, CAD s/p CABG 1995 with recent NSTEMI 11/2010 s/p DES to SVG-OM (resolute 3x15).  He had a subsequent NSTEMI in Sept 2012 and cath demonstrated diffuse disease (LIMA-LAD patient but significant left subclavian stenosis, 50-70% ISR of SVG-OM, 70-80% stenosis of SVG-PDA); this MI was medically treated.    He was admitted to [facility] on 09/23/11 with UTI/pyelo.  Shortly after that, he developed acute respiratory distress with hypercarbic respiratory failure requiring Bipap (? PNA vs. Volume overload).  His antibiotics were broadened and he was diuresed.  Today, he had intermittent chest pain and his troponins have risen to 4.72.  He was started on anticoagulation and transferred to [facility] CCM service.    Exam is notable for stable vitals (HR 90, BP 129/56, RR 18, O2 94% on 2L [place]).  JVD 5cm above clavicle, RRR with no murmurs, faint bibasilar crackles, +BS, trace LEE.   EKG: OSH demonstrate transient inferolateral ST depressions.  A/P:  1: NSTEMI - this is likely from demand ischemia (Type 2 MI); however, I cannot rule out a Type 1 MI (ACS).  Currently he is chest pain free.  Most recent troponin was 4.72 and is currently rising.  Recommend cycling cardiac enzymes.  Recommend continuing home medications of ASA, plavix, imdur, lisinopril, toprol, and statin.  Given additional one time dose of plavix 300mg  and start heparin IV (ACS protocol) for 48 hours.  Recommend conservative management strategy.  Check TTE.   2: Shortness of breath - likely multifactorial (volume, infection, ? Early ARDS).  He is mildly volume overload and recommend IV diuresis as you are doing.  Check TTE.  [facility] will follow along with you.   Gwendalyn Ege, MD Level 5 Consult

## 2011-09-25 ENCOUNTER — Encounter (HOSPITAL_COMMUNITY): Payer: Self-pay | Admitting: General Practice

## 2011-09-25 DIAGNOSIS — I214 Non-ST elevation (NSTEMI) myocardial infarction: Principal | ICD-10-CM

## 2011-09-25 LAB — LIPID PANEL
Cholesterol: 296 mg/dL — ABNORMAL HIGH (ref 0–200)
HDL: 24 mg/dL — ABNORMAL LOW (ref >39)
LDL Cholesterol: 207 mg/dL — ABNORMAL HIGH (ref 0–99)
Total CHOL/HDL Ratio: 12.3 RATIO
Triglycerides: 324 mg/dL — ABNORMAL HIGH (ref <150)
VLDL: 65 mg/dL — ABNORMAL HIGH (ref 0–40)

## 2011-09-25 LAB — GLUCOSE, CAPILLARY: Glucose-Capillary: 146 mg/dL — ABNORMAL HIGH (ref 70–99)

## 2011-09-25 LAB — CARDIAC PANEL(CRET KIN+CKTOT+MB+TROPI)
CK, MB: 5.9 ng/mL — ABNORMAL HIGH (ref 0.3–4.0)
Relative Index: INVALID (ref 0.0–2.5)
Relative Index: INVALID (ref 0.0–2.5)
Troponin I: 2.41 ng/mL (ref <0.30)
Troponin I: 1.69 ng/mL (ref <0.30)

## 2011-09-25 LAB — HEPARIN LEVEL (UNFRACTIONATED): Heparin Unfractionated: 0.24 IU/mL — ABNORMAL LOW (ref 0.30–0.70)

## 2011-09-25 LAB — CBC
Hemoglobin: 12 g/dL — ABNORMAL LOW (ref 13.0–17.0)
MCH: 27.6 pg (ref 26.0–34.0)
MCHC: 32 g/dL (ref 30.0–36.0)
MCV: 86.2 fL (ref 78.0–100.0)

## 2011-09-25 LAB — HEMOGLOBIN A1C
Hgb A1c MFr Bld: 6.9 % — ABNORMAL HIGH (ref <5.7)
Mean Plasma Glucose: 151 mg/dL — ABNORMAL HIGH (ref <117)

## 2011-09-25 MED ORDER — FUROSEMIDE 10 MG/ML IJ SOLN
20.0000 mg | Freq: Once | INTRAMUSCULAR | Status: DC
  Filled 2011-09-25: qty 2

## 2011-09-25 MED ORDER — FUROSEMIDE 40 MG PO TABS
40.0000 mg | ORAL_TABLET | Freq: Two times a day (BID) | ORAL | Status: DC
  Administered 2011-09-25 – 2011-09-29 (×8): 40 mg via ORAL
  Filled 2011-09-25 (×10): qty 1

## 2011-09-25 MED ORDER — HALOPERIDOL LACTATE 5 MG/ML IJ SOLN
INTRAMUSCULAR | Status: AC
  Filled 2011-09-25: qty 1

## 2011-09-25 MED ORDER — ROSUVASTATIN CALCIUM 40 MG PO TABS
40.0000 mg | ORAL_TABLET | Freq: Every day | ORAL | Status: DC
  Administered 2011-09-25 – 2011-09-28 (×4): 40 mg via ORAL
  Filled 2011-09-25 (×5): qty 1

## 2011-09-25 MED ORDER — HALOPERIDOL LACTATE 2 MG/ML PO CONC
3.0000 mg | Freq: Three times a day (TID) | ORAL | Status: DC | PRN
  Administered 2011-09-25: 3 mg via ORAL

## 2011-09-25 NOTE — H&P (Deleted)
Name: TAG WURTZ MRN: [id number] DOB: 01/09/1925    LOS: 1  PCCM ADMISSION NOTE  History of Present Illness: 78 y/o pleasant man with a Hx of Alzheimer disease and prostatism, presented to [facility] hospital on 1/13 for urinary retention. Was treated in ED with Foley Cath insertion and discharged. However, the patient returned to ED on 09/23/11 with acute abdominal pain. At that time, the patient was found to have "dirty" urine and pyelonephritic straining per CT of abdomen and was admitted to [facility]. Subsequently,  he developed CP with an increase in troponin from 0.5 up to 5. Cardiology consult started the patient on a heparin gtt for NSTEMI. The patient was transferred to [facility] under PCCM care on 09/24/11. Currently,  The patient is CP-free and denies any other concerns.  Lines / Drains: None  Cultures: Blood 1/13 >> Urine 1/13 >>  Antibiotics: Bactrim DS 1/12>>1/13 Cipro 1/13>>  Tests / Events: 2-D ECHO 1/14 >>  SUBJ: No complaints. No distress. RN notes agitated confusion last PM  Vital Signs: Temp:  [97.3 F (36.3 C)-98.8 F (37.1 C)] 97.3 F (36.3 C) (01/14 1247) Pulse Rate:  [53-107] 56  (01/14 1247) Resp:  [18-29] 18  (01/14 1247) BP: (97-154)/(45-81) 126/52 mmHg (01/14 1247) SpO2:  [83 %-100 %] 98 % (01/14 1247) Weight:  [83.9 kg (184 lb 15.5 oz)] 83.9 kg (184 lb 15.5 oz) (01/14 0500) I/O last 3 completed shifts: In: 466.7 [P.O.:120; I.V.:342.7; IV Piggyback:4] Out: 2700 [Urine:2700]  Physical Examination: General:  Alert and oriented x2 Neuro: Grossly intact; no focal deficits  HEENT:  No scleral icterus or injection; oropharynx with moist buccal mucosa. Neck:  Supple, no JVD's no bruits; no masses  Cardiovascular:  RRR, systolic EM grade 1/6 best heard at LUSB;  Lungs:  CTA Abdomen:  BS present; non distended; non tender; no guarding, no HSM Musculoskeletal:  Without joint deformity; erythema, effusion; moves all extremities Skin:  No lesions  or cyanosis. Right arm peripheral line without infiltration.   Labs and Imaging:  BMET    Component Value Date/Time   NA 138 09/24/2011 1952   K 4.9 09/24/2011 1952   CL 103 09/24/2011 1952   CO2 26 09/24/2011 1952   GLUCOSE 117* 09/24/2011 1952   BUN 25* 09/24/2011 1952   CREATININE 1.10 09/24/2011 1952   CALCIUM 8.4 09/24/2011 1952   GFRNONAA 60* 09/24/2011 1952   GFRAA 69* 09/24/2011 1952    CBC    Component Value Date/Time   WBC 7.6 09/25/2011 0555   RBC 4.35 09/25/2011 0555   HGB 12.0* 09/25/2011 0555   HCT 37.5* 09/25/2011 0555   PLT 179 09/25/2011 0555   MCV 86.2 09/25/2011 0555   MCH 27.6 09/25/2011 0555   MCHC 32.0 09/25/2011 0555   RDW 15.1 09/25/2011 0555   LYMPHSABS 1.6 09/24/2011 1952   MONOABS 0.5 09/24/2011 1952   EOSABS 0.3 09/24/2011 1952   BASOSABS 0.0 09/24/2011 1952    No new CXR  Assessment and Plan:  PULMONARY: Hx of ?pulmonary fibrosis, oxygen dependent. Clinically appears to be at baseline  CARDIOVASCULAR: NSTEMI, Hemodynamically stable, markers trending down, no active CP -Cards following -remains on hep gtt   Systolic CHF. Old Echo with EF of 45%. Repeat Echo pending. Compensated -Change Lasix back to baseline oral dose -Cards following  RENAL: Apparently AKD with Cr of 1.6 at [facility]. Creatinine from 1/13 is back to 1.1. Patient is making urine. AKI resolved  INFECTIOUS: UTI, Possible early pyelo by CT abd  Cont Cipro  ENDOCRINE: Hypothyroidism -recheck TSH Continue with home dose of Synthroid at 125 mcg PO  DM, type 2 -CBG's -hold metformin (?cath in am) -Cont SSI  Lab 09/25/11 0740 09/24/11 2116 09/24/11 1846  GLUCAP 146* 136* 129*    NEUROLOGIC: Hx of alzheimer disease, stable, no sundowning -continue Aricept   BEST PRACTICE / DISPOSITION: -->  ICU status under PCCM -->  Full code -->  NPO -->  Heparin [place] for DVT Px -->  Protonix IV for GI Px -->  Ventilator bundle -->  Family updated at bedside  Best practices /  Disposition: -->Transfer to Tele under care of [facility] beginning AM 1/15. PCCM will sign off -->full code but needs to be verified -->Heparin for DVT Px -->Heart healthy diet -->family updated at bedside   Billy Fischer 09/25/2011, 3:56 PM   Billy Fischer, MD;  PCCM service; Mobile [phone]

## 2011-09-25 NOTE — Progress Notes (Signed)
Subjective:  Chronically confused secondary to dementia. Denies chest pain. Admission lipids very high suggests poor patient compliance.  Objective:  Vital Signs in the last 24 hours: Temp:  [97.5 F (36.4 C)-98.8 F (37.1 C)] 98.8 F (37.1 C) (01/14 0737) Pulse Rate:  [53-106] 97  (01/14 0700) Resp:  [20-29] 24  (01/14 0700) BP: (97-148)/(45-81) 130/54 mmHg (01/14 0700) SpO2:  [86 %-100 %] 98 % (01/14 0700) Weight:  [184 lb 15.5 oz (83.9 kg)] 184 lb 15.5 oz (83.9 kg) (01/14 0500)  Intake/Output from previous day: 01/13 0701 - 01/14 0700 In: 466.7 [P.O.:120; I.V.:342.7; IV Piggyback:4] Out: 2700 [Urine:2700] Intake/Output from this shift:       . aspirin  81 mg Oral NOW  . aspirin  325 mg Oral Daily  . carbidopa-levodopa  1 tablet Oral BID WC  . ciprofloxacin  500 mg Oral BID  . clopidogrel  300 mg Oral Once  . clopidogrel  75 mg Oral Q breakfast  . donepezil  5 mg Oral QHS  . furosemide  20 mg Intravenous Once  . furosemide  40 mg Intravenous Q12H  . haloperidol lactate      . insulin aspart  0-9 Units Subcutaneous TID WC  . levothyroxine  125 mcg Oral QAC breakfast  . lisinopril  20 mg Oral Daily  . metoprolol succinate  25 mg Oral Daily  . rosuvastatin  40 mg Oral q1800  . DISCONTD: aspirin  300 mg Rectal NOW  . DISCONTD: heparin  5,000 Units Subcutaneous Q8H  . DISCONTD: simvastatin  40 mg Oral q1800      . heparin 1,000 Units/hr (09/24/11 2144)    Physical Exam: The patient appears to be in no distress.  Confused.  Head and neck exam reveals that the pupils are equal and reactive.  The extraocular movements are full.  There is no scleral icterus.  Mouth and pharynx are benign.  No lymphadenopathy.  No carotid bruits.  The jugular venous pressure is normal.  Thyroid is not enlarged or tender.  Chest is clear to percussion and auscultation.  No rales or rhonchi.  Expansion of the chest is symmetrical.  Heart reveals no abnormal lift or heave.  First and  second heart sounds are normal.  There is no murmur gallop rub or click.  The abdomen is soft and nontender.  Bowel sounds are normoactive.  There is no hepatosplenomegaly or mass.  There are no abdominal bruits.  Extremities reveal no phlebitis or edema.  Pedal pulses are good.  There is no cyanosis or clubbing.  Neurologic exam is normal strength and no lateralizing weakness.  No sensory deficits. Confused.  Integument reveals no rash  Lab Results:  Basename 09/25/11 0555 09/24/11 1952  WBC 7.6 8.5  HGB 12.0* 11.0*  PLT 179 137*    Basename 09/24/11 1952  NA 138  K 4.9  CL 103  CO2 26  GLUCOSE 117*  BUN 25*  CREATININE 1.10    Basename 09/25/11 0555 09/24/11 2226  TROPONINI 2.41* 3.84*   Hepatic Function Panel  Basename 09/24/11 1952  PROT 6.1  ALBUMIN 2.6*  AST 44*  ALT 45  ALKPHOS 130*  BILITOT 0.3  BILIDIR --  IBILI --    Basename 09/25/11 0045  CHOL 296*   No results found for this basename: PROTIME in the last 72 hours  Imaging: Imaging results have been reviewed  Cardiac Studies: 2D echo pending Assessment/Plan:  Patient Active Hospital Problem List:  NSTEMI      Hypercholesterolemia      Dementia      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       Switch to crestor.       Cycle enzymes.  Conservative Rx   LOS: 1 day    Cassell Clement 09/25/2011, 8:20 AM

## 2011-09-25 NOTE — Progress Notes (Signed)
Dr. Mayford Knife from [facility] paged, given lab values troponin 3.84, CKMB 9.3.  No new orders at this time.

## 2011-09-25 NOTE — Progress Notes (Signed)
ANTICOAGULATION CONSULT NOTE - Initial Consult  Pharmacy Consult for heparin Indication: chest pain/ACS  Allergies  Allergen Reactions  . Amiodarone Rash  . Morphine And Related Itching and Rash    Patient Measurements: Weight: 184 lb 15.5 oz (83.9 kg) Adjusted Body Weight: approximately 83 kg  Vital Signs: Temp: 97.9 F (36.6 C) (01/14 0400) Temp src: Oral (01/14 0400) BP: 135/56 mmHg (01/14 0500) Pulse Rate: 53  (01/14 0500)  Labs:  Basename 09/25/11 0555 09/25/11 0054 09/24/11 2226 09/24/11 1952  HGB 12.0* -- -- 11.0*  HCT 37.5* -- -- 34.1*  PLT 179 -- -- 137*  APTT -- -- -- --  LABPROT -- 13.1 -- --  INR -- 0.97 -- --  HEPARINUNFRC 0.44 -- -- --  CREATININE -- -- -- 1.10  CKTOTAL -- -- 92 --  CKMB -- -- 9.3* --  TROPONINI -- -- 3.84* --   The CrCl is unknown because both a height and weight (above a minimum accepted value) are required for this calculation.  Assessment: 78 yo male with NSTEMI for Heparin  Goal of Therapy:  Heparin level 0.3-0.7 units/ml   Plan:  Continue Heparin at current rate Recheck level this afternoon to verify.  Eddie Candle 09/25/2011,6:48 AM

## 2011-09-25 NOTE — Progress Notes (Signed)
ANTICOAGULATION CONSULT NOTE - Follow Up Consult  Pharmacy Consult for heparin  Indication: chest pain/ACS  Allergies  Allergen Reactions  . Amiodarone Rash  . Morphine And Related Itching and Rash    Patient Measurements: Height: 5' 11.5" (181.6 cm) Weight: 184 lb 15.5 oz (83.9 kg) IBW/kg (Calculated) : 76.45   Vital Signs: Temp: 97.3 F (36.3 C) (01/14 1247) Temp src: Oral (01/14 1247) BP: 126/52 mmHg (01/14 1247) Pulse Rate: 56  (01/14 1247)  Labs:  Basename 09/25/11 1400 09/25/11 0555 09/25/11 0054 09/24/11 2226 09/24/11 1952  HGB -- 12.0* -- -- 11.0*  HCT -- 37.5* -- -- 34.1*  PLT -- 179 -- -- 137*  APTT -- -- -- -- --  LABPROT -- -- 13.1 -- --  INR -- -- 0.97 -- --  HEPARINUNFRC 0.24* 0.44 -- -- --  CREATININE -- -- -- -- 1.10  CKTOTAL 43 63 -- 92 --  CKMB 3.9 5.9* -- 9.3* --  TROPONINI 1.69* 2.41* -- 3.84* --   Estimated Creatinine Clearance: 54.1 ml/min (by C-G formula based on Cr of 1.1).   Medications:  Infusions:    . heparin 1,000 Units/hr (09/24/11 2144)    Assessment: 86 yom on IV heparin for NSTEMI. Received a dose of lovenox 1mg /kg yesterday. Heparin level is 0.24 which is subtherapeutic. Earlier therapeutic level was likely partially a reflection of lovenox. Will increase heparin rate by 2units/kg/hr and recheck level. No bleeding or other problems noted.  Goal of Therapy:  Heparin level 0.3-0.7 units/ml   Plan:  1. Increase heparin gtt to 1150units/hr 2. Check 8 hour heparin level  Ananda Caya, Drake Leach 09/25/2011,3:21 PM

## 2011-09-25 NOTE — Progress Notes (Signed)
Transferred to 2013 via wheelchair--portable monitor and oxygen on.  No changes.

## 2011-09-25 NOTE — Progress Notes (Signed)
Name: [name] [name] MRN: [id number] DOB: [birth year]    LOS: 1  PCCM ADMISSION NOTE  History of Present Illness: 78 y/o pleasant man with a Hx of Alzheimer disease and prostatism, presented to [facility] hospital on 1/13 for urinary retention. Was treated in ED with Foley Cath insertion and discharged. However, the patient returned to ED on 09/23/11 with acute abdominal pain. At that time, the patient was found to have "dirty" urine and pyelonephritic straining per CT of abdomen and was admitted to [facility]. Subsequently,  he developed CP with an increase in troponin from 0.5 up to 5. Cardiology consult started the patient on a heparin gtt for NSTEMI. The patient was transferred to [facility] under PCCM care on 09/24/11. Currently,  The patient is CP-free and denies any other concerns.  Lines / Drains: None  Cultures: Blood 1/13 >> Urine 1/13 >>  Antibiotics: Bactrim DS 1/12>>1/13 Cipro 1/13>>  Tests / Events: 2-D ECHO 1/14 >>  SUBJ: No complaints. No distress. RN notes agitated confusion last PM  Vital Signs: Temp:  [97.3 F (36.3 C)-98.8 F (37.1 C)] 97.3 F (36.3 C) (01/14 1247) Pulse Rate:  [53-107] 56  (01/14 1247) Resp:  [18-29] 18  (01/14 1247) BP: (97-154)/(45-81) 126/52 mmHg (01/14 1247) SpO2:  [83 %-100 %] 98 % (01/14 1247) Weight:  [83.9 kg (184 lb 15.5 oz)] 83.9 kg (184 lb 15.5 oz) (01/14 0500) I/O last 3 completed shifts: In: 466.7 [P.O.:120; I.V.:342.7; IV Piggyback:4] Out: 2700 [Urine:2700]  Physical Examination: General:  Alert and oriented x2 Neuro: Grossly intact; no focal deficits  HEENT:  No scleral icterus or injection; oropharynx with moist buccal mucosa. Neck:  Supple, no JVD's no bruits; no masses  Cardiovascular:  RRR, systolic EM grade 1/6 best heard at LUSB;  Lungs:  CTA Abdomen:  BS present; non distended; non tender; no guarding, no HSM Musculoskeletal:  Without joint deformity; erythema, effusion; moves all extremities Skin:  No lesions  or cyanosis. Right arm peripheral line without infiltration.   Labs and Imaging:  BMET    Component Value Date/Time   NA 138 09/24/2011 1952   K 4.9 09/24/2011 1952   CL 103 09/24/2011 1952   CO2 26 09/24/2011 1952   GLUCOSE 117* 09/24/2011 1952   BUN 25* 09/24/2011 1952   CREATININE 1.10 09/24/2011 1952   CALCIUM 8.4 09/24/2011 1952   GFRNONAA 60* 09/24/2011 1952   GFRAA 69* 09/24/2011 1952    CBC    Component Value Date/Time   WBC 7.6 09/25/2011 0555   RBC 4.35 09/25/2011 0555   HGB 12.0* 09/25/2011 0555   HCT 37.5* 09/25/2011 0555   PLT 179 09/25/2011 0555   MCV 86.2 09/25/2011 0555   MCH 27.6 09/25/2011 0555   MCHC 32.0 09/25/2011 0555   RDW 15.1 09/25/2011 0555   LYMPHSABS 1.6 09/24/2011 1952   MONOABS 0.5 09/24/2011 1952   EOSABS 0.3 09/24/2011 1952   BASOSABS 0.0 09/24/2011 1952    No new CXR  Assessment and Plan:  PULMONARY: Hx of ?pulmonary fibrosis, oxygen dependent. Clinically appears to be at baseline  CARDIOVASCULAR: NSTEMI, Hemodynamically stable, markers trending down, no active CP -Cards following -remains on hep gtt   Systolic CHF. Old Echo with EF of 45%. Repeat Echo pending. Compensated -Change Lasix back to baseline oral dose -Cards following  RENAL: Apparently AKD with Cr of 1.6 at [facility]. Creatinine from 1/13 is back to 1.1. Patient is making urine. AKI resolved  INFECTIOUS: UTI, Possible early pyelo by CT abd  Cont Cipro  ENDOCRINE: Hypothyroidism -recheck TSH Continue with home dose of Synthroid at 125 mcg PO  DM, type 2 -CBG's -hold metformin (?cath in am) -Cont SSI  Lab 09/25/11 0740 09/24/11 2116 09/24/11 1846  GLUCAP 146* 136* 129*    NEUROLOGIC: Hx of alzheimer disease, stable, no sundowning -continue Aricept   BEST PRACTICE / DISPOSITION: -->  ICU status under PCCM -->  Full code -->  NPO -->  Heparin [place] for DVT Px -->  Protonix IV for GI Px -->  Ventilator bundle -->  Family updated at bedside  Best practices /  Disposition: -->Transfer to Tele under care of [facility] beginning AM 1/15. PCCM will sign off -->full code but needs to be verified -->Heparin for DVT Px -->Heart healthy diet -->family updated at bedside   Billy Fischer 09/25/2011, 5:55 PM   Billy Fischer, MD;  PCCM service; Mobile [phone]

## 2011-09-26 ENCOUNTER — Other Ambulatory Visit: Payer: Self-pay

## 2011-09-26 ENCOUNTER — Inpatient Hospital Stay (HOSPITAL_COMMUNITY): Payer: Medicare Other

## 2011-09-26 DIAGNOSIS — N12 Tubulo-interstitial nephritis, not specified as acute or chronic: Secondary | ICD-10-CM | POA: Diagnosis present

## 2011-09-26 LAB — BASIC METABOLIC PANEL
BUN: 14 mg/dL (ref 6–23)
CO2: 27 mEq/L (ref 19–32)
Calcium: 9 mg/dL (ref 8.4–10.5)
GFR calc non Af Amer: 76 mL/min — ABNORMAL LOW (ref >90)
Glucose, Bld: 169 mg/dL — ABNORMAL HIGH (ref 70–99)
Sodium: 140 mEq/L (ref 135–145)

## 2011-09-26 LAB — CBC
Hemoglobin: 11 g/dL — ABNORMAL LOW (ref 13.0–17.0)
MCH: 27.6 pg (ref 26.0–34.0)
MCHC: 32.4 g/dL (ref 30.0–36.0)
MCV: 85.2 fL (ref 78.0–100.0)
Platelets: 169 10*3/uL (ref 150–400)
RBC: 3.99 MIL/uL — ABNORMAL LOW (ref 4.22–5.81)

## 2011-09-26 MED ORDER — HEPARIN BOLUS VIA INFUSION
1500.0000 [IU] | Freq: Once | INTRAVENOUS | Status: AC
  Administered 2011-09-26: 1500 [IU] via INTRAVENOUS
  Filled 2011-09-26: qty 1500

## 2011-09-26 MED ORDER — ENOXAPARIN SODIUM 40 MG/0.4ML ~~LOC~~ SOLN
40.0000 mg | SUBCUTANEOUS | Status: DC
  Administered 2011-09-26 – 2011-09-29 (×4): 40 mg via SUBCUTANEOUS
  Filled 2011-09-26 (×4): qty 0.4

## 2011-09-26 MED ORDER — TAMSULOSIN HCL 0.4 MG PO CAPS
0.4000 mg | ORAL_CAPSULE | Freq: Every day | ORAL | Status: DC
  Administered 2011-09-26 – 2011-09-28 (×3): 0.4 mg via ORAL
  Filled 2011-09-26 (×4): qty 1

## 2011-09-26 NOTE — Progress Notes (Signed)
Ask secretary to order pulse ox around 2000. Still waiting for it

## 2011-09-26 NOTE — Progress Notes (Signed)
ANTICOAGULATION CONSULT NOTE - Follow Up Consult  Pharmacy Consult for heparin  Indication: chest pain/ACS  Allergies  Allergen Reactions  . Amiodarone Rash  . Morphine And Related Itching and Rash    Patient Measurements: Height: 5' 11.5" (181.6 cm) Weight: 184 lb 15.5 oz (83.9 kg) IBW/kg (Calculated) : 76.45   Vital Signs: Temp: 97.9 F (36.6 C) (01/14 2208) Temp src: Oral (01/14 2208) BP: 110/48 mmHg (01/14 2208) Pulse Rate: 66  (01/14 2208)  Labs:  Basename 09/25/11 2314 09/25/11 1400 09/25/11 0555 09/25/11 0054 09/24/11 2226 09/24/11 1952  HGB -- -- 12.0* -- -- 11.0*  HCT -- -- 37.5* -- -- 34.1*  PLT -- -- 179 -- -- 137*  APTT -- -- -- -- -- --  LABPROT -- -- -- 13.1 -- --  INR -- -- -- 0.97 -- --  HEPARINUNFRC 0.21* 0.24* 0.44 -- -- --  CREATININE -- -- -- -- -- 1.10  CKTOTAL -- 43 63 -- 92 --  CKMB -- 3.9 5.9* -- 9.3* --  TROPONINI -- 1.69* 2.41* -- 3.84* --   Estimated Creatinine Clearance: 54.1 ml/min (by C-G formula based on Cr of 1.1).  Assessment: 78 yo male with NSTEMI for Heparin  Goal of Therapy:  Heparin level 0.3-0.7 units/ml   Plan:  Heparin 1500 units IV bolus, then increase 1350 units/hr Check heparin level in 6 hours.  Almir Botts, Gary Fleet 09/26/2011,12:42 AM

## 2011-09-26 NOTE — Progress Notes (Signed)
Subjective:  Recovering from type II NSTEMI secondary to demand ischemia. Denies chest pain. Rhythm NSR with frequent PVCs [chronic].  echocardiogram not yet done.  Objective:  Vital Signs in the last 24 hours: Temp:  [97.3 F (36.3 C)-98.1 F (36.7 C)] 98.1 F (36.7 C) (01/15 0512) Pulse Rate:  [56-107] 91  (01/15 0512) Resp:  [18-27] 20  (01/15 0512) BP: (106-154)/(48-74) 133/74 mmHg (01/15 0512) SpO2:  [83 %-100 %] 99 % (01/15 0512) Weight:  [176 lb 9.4 oz (80.1 kg)] 176 lb 9.4 oz (80.1 kg) (01/15 0512)  Intake/Output from previous day: 01/14 0701 - 01/15 0700 In: 1589.4 [P.O.:120; I.V.:544.4] Out: 1051 [Urine:1050; Stool:1] Intake/Output from this shift:       . aspirin  325 mg Oral Daily  . carbidopa-levodopa  1 tablet Oral BID WC  . ciprofloxacin  500 mg Oral BID  . clopidogrel  75 mg Oral Q breakfast  . donepezil  5 mg Oral QHS  . furosemide  40 mg Oral BID  . haloperidol lactate      . heparin  1,500 Units Intravenous Once  . levothyroxine  125 mcg Oral QAC breakfast  . lisinopril  20 mg Oral Daily  . metoprolol succinate  25 mg Oral Daily  . rosuvastatin  40 mg Oral q1800  . DISCONTD: furosemide  20 mg Intravenous Once  . DISCONTD: furosemide  40 mg Intravenous Q12H  . DISCONTD: insulin aspart  0-9 Units Subcutaneous TID WC  . DISCONTD: simvastatin  40 mg Oral q1800      . heparin 1,350 Units/hr (09/26/11 0103)    Physical Exam: The patient appears to be in no distress.  Confused.  Head and neck exam reveals that the pupils are equal and reactive.  The extraocular movements are full.  There is no scleral icterus.  Mouth and pharynx are benign.  No lymphadenopathy.  No carotid bruits.  The jugular venous pressure is normal.  Thyroid is not enlarged or tender.  Chest is clear to percussion and auscultation.  No rales or rhonchi.  Expansion of the chest is symmetrical.  Heart reveals no abnormal lift or heave.  First and second heart sounds are normal.   There is no murmur gallop rub or click.  The abdomen is soft and nontender.  Bowel sounds are normoactive.  There is no hepatosplenomegaly or mass.  There are no abdominal bruits.  Extremities reveal no phlebitis or edema.  Pedal pulses are good.  There is no cyanosis or clubbing.  Neurologic exam is normal strength and no lateralizing weakness.  No sensory deficits. Confused.  Integument reveals no rash  Lab Results:  Basename 09/25/11 0555 09/24/11 1952  WBC 7.6 8.5  HGB 12.0* 11.0*  PLT 179 137*    Basename 09/24/11 1952  NA 138  K 4.9  CL 103  CO2 26  GLUCOSE 117*  BUN 25*  CREATININE 1.10    Basename 09/25/11 1400 09/25/11 0555  TROPONINI 1.69* 2.41*   Hepatic Function Panel  Basename 09/24/11 1952  PROT 6.1  ALBUMIN 2.6*  AST 44*  ALT 45  ALKPHOS 130*  BILITOT 0.3  BILIDIR --  IBILI --    Basename 09/25/11 0045  CHOL 296*   No results found for this basename: PROTIME in the last 72 hours  Imaging: Imaging results have been reviewed  Cardiac Studies: 2D echo pending Assessment/Plan:  Patient Active Hospital Problem List:      NSTEMI      Hypercholesterolemia  Dementia      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       Switch to crestor.       Cycle enzymes.  Conservative Rx       Increase activity.       DC IV heparin. Continue ASA and Plavix.       DC continuous pulse ox   LOS: 2 days    Cassell Clement 09/26/2011, 8:01 AM

## 2011-09-26 NOTE — Progress Notes (Signed)
Subjective: No chest pain; no SOB. Denies abdominal pain, diarrhea or any other acute complaint. No fever  Objective: Vital signs in last 24 hours: Temp:  [97.3 F (36.3 C)-98.1 F (36.7 C)] 98.1 F (36.7 C) (01/15 0512) Pulse Rate:  [56-91] 91  (01/15 0512) Resp:  [18-20] 20  (01/15 0512) BP: (110-133)/(48-74) 120/56 mmHg (01/15 1000) SpO2:  [98 %-100 %] 99 % (01/15 0512) Weight:  [80.1 kg (176 lb 9.4 oz)] 80.1 kg (176 lb 9.4 oz) (01/15 0512) Weight change: -3.8 kg (-8 lb 6 oz) Last BM Date: 09/25/11  Intake/Output from previous day: 01/14 0701 - 01/15 0700 In: 1589.4 [P.O.:120; I.V.:544.4] Out: 1051 [Urine:1050; Stool:1]     Physical Exam: General: Alert, awake, oriented x2, in no acute distress. Mentation at baseline due to dementia. HEENT: No bruits, no goiter. Heart: Regular rate, without murmurs, rubs or gallops. Lungs: Clear to auscultation bilaterally. Abdomen: Soft, nontender, nondistended, positive bowel sounds. Extremities: No clubbing cyanosis or edema with positive pedal pulses. Neuro: Grossly intact, nonfocal.   Lab Results: Basic Metabolic Panel:  Basename 09/26/11 0815 09/24/11 1952  NA 140 138  K 4.4 4.9  CL 103 103  CO2 27 26  GLUCOSE 169* 117*  BUN 14 25*  CREATININE 0.91 1.10  CALCIUM 9.0 8.4  MG -- 2.6*  PHOS -- --   Liver Function Tests:  [facility] 09/24/11 1952  AST 44*  ALT 45  ALKPHOS 130*  BILITOT 0.3  PROT 6.1  ALBUMIN 2.6*    Basename 09/24/11 1952  LIPASE 37  AMYLASE --   CBC:  Basename 09/26/11 0815 09/25/11 0555 09/24/11 1952  WBC 6.5 7.6 --  NEUTROABS -- -- 6.1  HGB 11.0* 12.0* --  HCT 34.0* 37.5* --  MCV 85.2 86.2 --  PLT 169 179 --   Cardiac Enzymes:  Basename 09/25/11 1400 09/25/11 0555 09/24/11 2226  CKTOTAL 43 63 92  CKMB 3.9 5.9* 9.3*  CKMBINDEX -- -- --  TROPONINI 1.69* 2.41* 3.84*   BNP:  Basename 09/24/11 1957  PROBNP 11492.0*   CBG:  Basename 09/25/11 0740 09/24/11 2116 09/24/11 1846    GLUCAP 146* 136* 129*   Hemoglobin A1C:  Basename 09/24/11 1952  HGBA1C 6.9*   Fasting Lipid Panel:  Basename 09/25/11 0045  CHOL 296*  HDL 24*  LDLCALC 207*  TRIG 324*  CHOLHDL 12.3  LDLDIRECT --   Thyroid Function Tests:  Basename 09/25/11 0054  TSH 0.999  T4TOTAL --  FREET4 --  T3FREE --  THYROIDAB --   Coagulation:  Basename 09/25/11 0054  LABPROT 13.1  INR 0.97    Misc. Labs:  Recent Results (from the past 240 hour(s))  CULTURE, BLOOD (ROUTINE X 2)     Status: Normal (Preliminary result)   Collection Time   09/24/11  7:29 PM      Component Value Range Status Comment   Specimen Description BLOOD LEFT ARM   Final    Special Requests BOTTLES DRAWN AEROBIC ONLY 4CC   Final    Setup Time [id number]   Final    Culture     Final    Value:        BLOOD CULTURE RECEIVED NO GROWTH TO DATE CULTURE WILL BE HELD FOR 5 DAYS BEFORE ISSUING A FINAL NEGATIVE REPORT   Report Status PENDING   Incomplete   CULTURE, BLOOD (ROUTINE X 2)     Status: Normal (Preliminary result)   Collection Time   09/24/11  7:56 PM  Component Value Range Status Comment   Specimen Description BLOOD RIGHT HAND   Final    Special Requests BOTTLES DRAWN AEROBIC ONLY 4CC   Final    Setup Time [id number]   Final    Culture     Final    Value:        BLOOD CULTURE RECEIVED NO GROWTH TO DATE CULTURE WILL BE HELD FOR 5 DAYS BEFORE ISSUING A FINAL NEGATIVE REPORT   Report Status PENDING   Incomplete   URINE CULTURE     Status: Normal   Collection Time   09/24/11  8:22 PM      Component Value Range Status Comment   Specimen Description URINE, CATHETERIZED   Final    Special Requests NONE   Final    Setup Time [id number]   Final    Colony Count NO GROWTH   Final    Culture NO GROWTH   Final    Report Status 09/26/2011 FINAL   Final   MRSA PCR SCREENING     Status: Normal   Collection Time   09/25/11 10:31 AM      Component Value Range Status Comment   MRSA by PCR NEGATIVE  NEGATIVE   Final     Studies/Results: Dg Chest Port 1 View  09/26/2011  *RADIOLOGY REPORT*  Clinical Data: Respiratory failure.  PORTABLE CHEST - 1 VIEW  Comparison: Multiple priors, most recently chest x-ray 09/24/2011.  Findings: Postoperative changes of median sternotomy for CABG with LIMA again noted.  Right lung is well expanded.  There continues to be elevation of the left hemidiaphragm.  There is cephalization of the pulmonary vasculature with indistinct interstitial markings and patchy air space disease throughout the lungs bilaterally, slightly improved compared with 09/24/2011, likely reflects a resolving pulmonary edema (superimposed infection or alveolar hemorrhage would be difficult to exclude).  There continues to be a more focal opacity in the left lower lobe with blunting left costophrenic sulcus, suggestive of superimposed areas of atelectasis and small left-sided pleural effusion.  An azygos lobe (normal anatomical variant) is incidentally noted.  Atherosclerotic calcifications are noted in the arch of the aorta.  IMPRESSION:  1.  Compared to recent prior study 09/24/2011 there appears to be slight improvement in underlying pulmonary edema.  Radiographic appearance of chest is otherwise very similar, as above.  Original Report Authenticated By: Florencia Reasons, M.D.   Portable Chest Xray  09/24/2011  *RADIOLOGY REPORT*  Clinical Data: Chest pain and shortness of breath; vascular congestion.  PORTABLE CHEST - 1 VIEW  Comparison: Chest radiograph performed 06/07/2011  Findings: The lungs are well-aerated.  Increased density of the left hemithorax may reflect a small left pleural effusion. Underlying airspace opacity may reflect atelectasis or possibly pneumonia.  Vascular congestion is noted; increased interstitial markings raise concern for mild interstitial edema.  No pneumothorax is seen.  The cardiomediastinal silhouette is borderline normal in size; the patient is status post median sternotomy.   No acute osseous abnormalities are seen.  IMPRESSION: Vascular congestion; increased interstitial markings may reflect mild interstitial edema.  Superimposed pneumonia cannot be excluded.  Likely small left pleural effusion noted.  Original Report Authenticated By: Tonia Ghent, M.D.    Medications: Scheduled Meds:   . aspirin  325 mg Oral Daily  . carbidopa-levodopa  1 tablet Oral BID WC  . ciprofloxacin  500 mg Oral BID  . clopidogrel  75 mg Oral Q breakfast  . donepezil  5 mg Oral QHS  . enoxaparin (  LOVENOX) injection  40 mg Subcutaneous Q24H  . furosemide  40 mg Oral BID  . haloperidol lactate      . heparin  1,500 Units Intravenous Once  . levothyroxine  125 mcg Oral QAC breakfast  . lisinopril  20 mg Oral Daily  . metoprolol succinate  25 mg Oral Daily  . rosuvastatin  40 mg Oral q1800   Continuous Infusions:   . DISCONTD: heparin 1,350 Units/hr (09/26/11 0103)   PRN Meds:.nitroGLYCERIN, DISCONTD: sodium chloride  Assessment/Plan: 1-NSTEMI (non-ST elevated myocardial infarction): per cardiology, start plavix and ASA; stop heparin drip and use lovenox for prophylaxis. Will cycle cardiac enzymes, check telemetry and follow 2-D echo. Continue statins.  2-Ischemic cardiomyopathy: Will follow 2-D echo; strict's I's and O's and daily weight. Patient on heart healthy diet. Continue Lasix.  3-Systolic HF (heart failure): continue Lasix and heart healthy diet.  4-Diabetes mellitus: diet control. Blood glucose in the 120's to 140's without medications. Continue monitoring.  5-Hypothyroidism: continue synthroid  6-Dementia: continue Aricept  7-PAF (paroxysmal atrial fibrillation): Continue B-blocker  8-Pyelonephritis: continue Ciprofloxacin.    LOS: 2 days   Ladiamond Gallina [facility] [phone]  09/26/2011, 12:42 PM

## 2011-09-27 ENCOUNTER — Other Ambulatory Visit: Payer: Self-pay

## 2011-09-27 DIAGNOSIS — I369 Nonrheumatic tricuspid valve disorder, unspecified: Secondary | ICD-10-CM

## 2011-09-27 LAB — CARDIAC PANEL(CRET KIN+CKTOT+MB+TROPI)
CK, MB: 1.7 ng/mL (ref 0.3–4.0)
Total CK: 21 U/L (ref 7–232)

## 2011-09-27 LAB — CBC
HCT: 35 % — ABNORMAL LOW (ref 39.0–52.0)
Hemoglobin: 11.3 g/dL — ABNORMAL LOW (ref 13.0–17.0)
MCHC: 32.3 g/dL (ref 30.0–36.0)
RBC: 4.1 MIL/uL — ABNORMAL LOW (ref 4.22–5.81)

## 2011-09-27 NOTE — Progress Notes (Signed)
Cardiac Rehab 1330 We came to assess pt for ambulation. Pt's wife in room. She stated pt very weak and could barely stand to to to chair. We talked with NT in room who agreed pt weak and grabbed to her when getting to chair pt being afraid of falling. Wife does not feel that pt is strong enough to return to [place]. Pt has PT consult. Since plans need to be made concerning type of facility for discharge and pt so weak feel that PT should evaluate first. Pt can notify us when pt ready for our services. Shayla Heming DunlapRN

## 2011-09-27 NOTE — Progress Notes (Signed)
Subjective:  Recovering from type II NSTEMI secondary to demand ischemia. Denies chest pain. Rhythm NSR with frequent PVCs [chronic].  echocardiogram not yet done. Echo to be done today.  Objective:  Vital Signs in the last 24 hours: Temp:  [97.4 F (36.3 C)-98 F (36.7 C)] 98 F (36.7 C) (01/16 0653) Pulse Rate:  [51-75] 51  (01/16 0653) Resp:  [18-20] 18  (01/16 0653) BP: (107-123)/(41-76) 107/52 mmHg (01/16 0653) SpO2:  [97 %-99 %] 98 % (01/16 0653) Weight:  [173 lb 8 oz (78.7 kg)] 173 lb 8 oz (78.7 kg) (01/16 0653)  Intake/Output from previous day: 01/15 0701 - 01/16 0700 In: -  Out: 2350 [Urine:2350] Intake/Output from this shift:       . aspirin  325 mg Oral Daily  . carbidopa-levodopa  1 tablet Oral BID WC  . ciprofloxacin  500 mg Oral BID  . clopidogrel  75 mg Oral Q breakfast  . donepezil  5 mg Oral QHS  . enoxaparin (LOVENOX) injection  40 mg Subcutaneous Q24H  . furosemide  40 mg Oral BID  . levothyroxine  125 mcg Oral QAC breakfast  . lisinopril  20 mg Oral Daily  . metoprolol succinate  25 mg Oral Daily  . rosuvastatin  40 mg Oral q1800  . Tamsulosin HCl  0.4 mg Oral QPC supper      . DISCONTD: heparin 1,350 Units/hr (09/26/11 0103)    Physical Exam: The patient appears to be in no distress.  Confused.  Head and neck exam reveals that the pupils are equal and reactive.  The extraocular movements are full.  There is no scleral icterus.  Mouth and pharynx are benign.  No lymphadenopathy.  No carotid bruits.  The jugular venous pressure is normal.  Thyroid is not enlarged or tender.  Chest is clear to percussion and auscultation.  No rales or rhonchi.  Expansion of the chest is symmetrical.  Heart reveals no abnormal lift or heave.  First and second heart sounds are normal.  There is no murmur gallop rub or click. Rhythm NSR with frequent PVCs  The abdomen is soft and nontender.  Bowel sounds are normoactive.  There is no hepatosplenomegaly or mass.   There are no abdominal bruits.  Extremities reveal no phlebitis or edema.  Pedal pulses are good.  There is no cyanosis or clubbing.  Neurologic exam is normal strength and no lateralizing weakness.  No sensory deficits. Confused.  Integument reveals no rash  Lab Results:  Basename 09/27/11 0550 09/26/11 0815  WBC 7.3 6.5  HGB 11.3* 11.0*  PLT 223 169    Basename 09/26/11 0815 09/24/11 1952  NA 140 138  K 4.4 4.9  CL 103 103  CO2 27 26  GLUCOSE 169* 117*  BUN 14 25*  CREATININE 0.91 1.10    Basename 09/25/11 1400 09/25/11 0555  TROPONINI 1.69* 2.41*   Hepatic Function Panel  Basename 09/24/11 1952  PROT 6.1  ALBUMIN 2.6*  AST 44*  ALT 45  ALKPHOS 130*  BILITOT 0.3  BILIDIR --  IBILI --    Basename 09/25/11 0045  CHOL 296*   No results found for this basename: PROTIME in the last 72 hours  Imaging: Imaging results have been reviewed  Cardiac Studies: 2D echo pending Assessment/Plan:  Patient Active Hospital Problem List:      NSTEMI      Hypercholesterolemia      Dementia      Pulmonary fibrosis and respiratory insufficiency secondary to  amiodarone  Plan:  2D echo to be done today. Try to increase activity today.  Phase I Rehab.  Walk in hall. Hopefully return to his [address] with his wife soon.   LOS: 3 days    Cassell Clement 09/27/2011, 7:51 AM

## 2011-09-27 NOTE — Progress Notes (Signed)
  Echocardiogram 2D Echocardiogram has been performed.  Jorje Guild [facility] 09/27/2011, 10:23 AM

## 2011-09-27 NOTE — Progress Notes (Signed)
Subjective: No chest pain; no SOB. Denies abdominal pain, diarrhea or any other acute complaint. No fever Feeling weak and requiring assistance to use bedside commode. Family at bedside updated.  Objective: Vital signs in last 24 hours: Temp:  [97.4 F (36.3 C)-98 F (36.7 C)] 98 F (36.7 C) (01/16 0653) Pulse Rate:  [51-75] 51  (01/16 0653) Resp:  [18-20] 18  (01/16 0653) BP: (107-123)/(41-76) 107/52 mmHg (01/16 0653) SpO2:  [97 %-99 %] 98 % (01/16 0653) Weight:  [78.7 kg (173 lb 8 oz)] 78.7 kg (173 lb 8 oz) (01/16 0653) Weight change: -1.4 kg (-3 lb 1.4 oz) Last BM Date: 09/27/11  Intake/Output from previous day: 01/15 0701 - 01/16 0700 In: -  Out: 2350 [Urine:2350] Total I/O In: 240 [P.O.:240] Out: -    Physical Exam: General: Alert, awake, oriented x2, in no acute distress. Mentation at baseline due to dementia. HEENT: No bruits, no goiter. Heart: Regular rate, without murmurs, rubs or gallops. Lungs: Clear to auscultation bilaterally. Abdomen: Soft, nontender, nondistended, positive bowel sounds. Extremities: No clubbing cyanosis or edema with positive pedal pulses. Neuro: Grossly intact, nonfocal.   Lab Results: Basic Metabolic Panel:  Basename 09/26/11 0815 09/24/11 1952  NA 140 138  K 4.4 4.9  CL 103 103  CO2 27 26  GLUCOSE 169* 117*  BUN 14 25*  CREATININE 0.91 1.10  CALCIUM 9.0 8.4  MG -- 2.6*  PHOS -- --   Liver Function Tests:  [facility] 09/24/11 1952  AST 44*  ALT 45  ALKPHOS 130*  BILITOT 0.3  PROT 6.1  ALBUMIN 2.6*    Basename 09/24/11 1952  LIPASE 37  AMYLASE --   CBC:  Basename 09/27/11 0550 09/26/11 0815 09/24/11 1952  WBC 7.3 6.5 --  NEUTROABS -- -- 6.1  HGB 11.3* 11.0* --  HCT 35.0* 34.0* --  MCV 85.4 85.2 --  PLT 223 169 --   Cardiac Enzymes:  Basename 09/25/11 1400 09/25/11 0555 09/24/11 2226  CKTOTAL 43 63 92  CKMB 3.9 5.9* 9.3*  CKMBINDEX -- -- --  TROPONINI 1.69* 2.41* 3.84*   BNP:  Basename 09/24/11 1957    PROBNP 11492.0*   CBG:  Basename 09/25/11 0740 09/24/11 2116 09/24/11 1846  GLUCAP 146* 136* 129*   Hemoglobin A1C:  Basename 09/24/11 1952  HGBA1C 6.9*   Fasting Lipid Panel:  Basename 09/25/11 0045  CHOL 296*  HDL 24*  LDLCALC 207*  TRIG 324*  CHOLHDL 12.3  LDLDIRECT --   Thyroid Function Tests:  Basename 09/25/11 0054  TSH 0.999  T4TOTAL --  FREET4 --  T3FREE --  THYROIDAB --   Coagulation:  Basename 09/25/11 0054  LABPROT 13.1  INR 0.97    Misc. Labs:  Recent Results (from the past 240 hour(s))  CULTURE, BLOOD (ROUTINE X 2)     Status: Normal (Preliminary result)   Collection Time   09/24/11  7:29 PM      Component Value Range Status Comment   Specimen Description BLOOD LEFT ARM   Final    Special Requests BOTTLES DRAWN AEROBIC ONLY 4CC   Final    Setup Time [id number]   Final    Culture     Final    Value:        BLOOD CULTURE RECEIVED NO GROWTH TO DATE CULTURE WILL BE HELD FOR 5 DAYS BEFORE ISSUING A FINAL NEGATIVE REPORT   Report Status PENDING   Incomplete   CULTURE, BLOOD (ROUTINE X 2)     Status:  Normal (Preliminary result)   Collection Time   09/24/11  7:56 PM      Component Value Range Status Comment   Specimen Description BLOOD RIGHT HAND   Final    Special Requests BOTTLES DRAWN AEROBIC ONLY 4CC   Final    Setup Time [id number]   Final    Culture     Final    Value:        BLOOD CULTURE RECEIVED NO GROWTH TO DATE CULTURE WILL BE HELD FOR 5 DAYS BEFORE ISSUING A FINAL NEGATIVE REPORT   Report Status PENDING   Incomplete   URINE CULTURE     Status: Normal   Collection Time   09/24/11  8:22 PM      Component Value Range Status Comment   Specimen Description URINE, CATHETERIZED   Final    Special Requests NONE   Final    Setup Time [id number]   Final    Colony Count NO GROWTH   Final    Culture NO GROWTH   Final    Report Status 09/26/2011 FINAL   Final   MRSA PCR SCREENING     Status: Normal   Collection Time   09/25/11 10:31 AM       Component Value Range Status Comment   MRSA by PCR NEGATIVE  NEGATIVE  Final     Studies/Results: Dg Chest Port 1 View  09/26/2011  *RADIOLOGY REPORT*  Clinical Data: Respiratory failure.  PORTABLE CHEST - 1 VIEW  Comparison: Multiple priors, most recently chest x-ray 09/24/2011.  Findings: Postoperative changes of median sternotomy for CABG with LIMA again noted.  Right lung is well expanded.  There continues to be elevation of the left hemidiaphragm.  There is cephalization of the pulmonary vasculature with indistinct interstitial markings and patchy air space disease throughout the lungs bilaterally, slightly improved compared with 09/24/2011, likely reflects a resolving pulmonary edema (superimposed infection or alveolar hemorrhage would be difficult to exclude).  There continues to be a more focal opacity in the left lower lobe with blunting left costophrenic sulcus, suggestive of superimposed areas of atelectasis and small left-sided pleural effusion.  An azygos lobe (normal anatomical variant) is incidentally noted.  Atherosclerotic calcifications are noted in the arch of the aorta.  IMPRESSION:  1.  Compared to recent prior study 09/24/2011 there appears to be slight improvement in underlying pulmonary edema.  Radiographic appearance of chest is otherwise very similar, as above.  Original Report Authenticated By: Florencia Reasons, M.D.    Medications: Scheduled Meds:    . aspirin  325 mg Oral Daily  . carbidopa-levodopa  1 tablet Oral BID WC  . ciprofloxacin  500 mg Oral BID  . clopidogrel  75 mg Oral Q breakfast  . donepezil  5 mg Oral QHS  . enoxaparin (LOVENOX) injection  40 mg Subcutaneous Q24H  . furosemide  40 mg Oral BID  . levothyroxine  125 mcg Oral QAC breakfast  . lisinopril  20 mg Oral Daily  . metoprolol succinate  25 mg Oral Daily  . rosuvastatin  40 mg Oral q1800  . Tamsulosin HCl  0.4 mg Oral QPC supper   Continuous Infusions:  PRN  Meds:.nitroGLYCERIN  Assessment/Plan: 1-NSTEMI (non-ST elevated myocardial infarction): per cardiology, start plavix and ASA; Will continue statins and will follow 2-D echo done today. PT/OT to see him and start increasing his activity.  2-Ischemic cardiomyopathy: CE'Z trending down; 1 more set pending; will follow. Continue treatment as mentioned on problem #1 and #  .  3-Systolic HF (heart failure): continue Lasix and heart healthy diet.  4-Diabetes mellitus: diet control. Blood glucose in the 120's to 140's without medications. Continue monitoring.  5-Hypothyroidism: continue Levothyroxine  6-Dementia: continue Aricept.  7-PAF (paroxysmal atrial fibrillation): Continue B-blocker.  8-Pyelonephritis: continue Ciprofloxacin.  9-urinary retention: continue Flomax and keep foley in place for now.    LOS: 3 days   Avyanna Spada [facility] [phone]  09/27/2011, 1:35 PM

## 2011-09-28 LAB — CBC
MCH: 27.4 pg (ref 26.0–34.0)
MCHC: 31.8 g/dL (ref 30.0–36.0)
MCV: 86.1 fL (ref 78.0–100.0)
Platelets: 218 10*3/uL (ref 150–400)
RBC: 4.02 MIL/uL — ABNORMAL LOW (ref 4.22–5.81)

## 2011-09-28 NOTE — Progress Notes (Signed)
Occupational Therapy Evaluation Patient Details Name: [name] [name] MRN: [id number] DOB: 03/05/1925 Today's Date: 09/28/2011  Problem List:  Patient Active Problem List  Diagnoses  . DYSPNEA  . Ischemic cardiomyopathy  . CAD (coronary artery disease)  . Systolic HF (heart failure)  . NSTEMI (non-ST elevated myocardial infarction)  . Status post coronary artery stent placement  . Anemia  . Diabetes mellitus  . Hypothyroidism  . Dementia  . PAF (paroxysmal atrial fibrillation)  . Pyelonephritis    Past Medical History:  Past Medical History  Diagnosis Date  . Ischemic cardiomyopathy     EF 40 to 45%  . CAD (coronary artery disease)     with remote CABG in 1996, s/p PCI to SVG to OM in March 2012; repeat cath in September 2012 - managed medically  . Hx of CABG   . Hyperlipidemia   . V-tach     Previously on amiodarone  . Pulmonary fibrosis     Due to amiodarone  . PVD (peripheral vascular disease)     R CEA  . Hypothyroidism   . Insulin dependent diabetes mellitus   . H/O: lung cancer 2006    s/p resection and lymph node dissection  . Dementia   . Systolic HF (heart failure)   . NSTEMI (non-ST elevated myocardial infarction) 2012  . Supplemental oxygen dependent   . Status post coronary artery stent placement 11/2010    DES to SVG to OM  . PAF (paroxysmal atrial fibrillation)     Not a candidate for coumadin therapy  . Angina   . Shortness of breath   . Diabetes mellitus     Insulin dependent   Past Surgical History:  Past Surgical History  Procedure Date  . Coronary artery bypass graft 1996    x 5  . Des stent to vein graft to om March 2012  . Right cea   . Cardiac catheterization March 2012  . Lobectomy     OT Assessment/Plan/Recommendation OT Assessment Clinical Impression Statement: Pt admitted with urinary retention and NSTEMI and presents with below problem list. Will benefit from skilled OT to maximize I with ADL and ADL mobility in order to  facilitate safe d/c home with wife. If wife is unable to provide necessary level of A at d/c, pt will need ST-SNF OT Recommendation/Assessment: Patient will need skilled OT in the acute care venue OT Problem List: Decreased strength;Decreased activity tolerance;Impaired balance (sitting and/or standing);Decreased knowledge of use of DME or AE;Decreased knowledge of precautions;Cardiopulmonary status limiting activity OT Therapy Diagnosis : Generalized weakness OT Plan OT Frequency: Min 2X/week OT Treatment/Interventions: Self-care/ADL training;Therapeutic exercise;DME and/or AE instruction;Energy conservation;Therapeutic activities;Patient/family education OT Recommendation Follow Up Recommendations: Home health OT;Supervision/Assistance - 24 hour (May need ST-SNF if wife unable to provide necessary assist) Equipment Recommended: Rolling walker with 5" wheels Individuals Consulted Consulted and Agree with Results and Recommendations: Patient OT Goals Acute Rehab OT Goals OT Goal Formulation: With patient Time For Goal Achievement: 7 days ADL Goals Pt Will Perform Grooming: with modified independence;Standing at sink ADL Goal: Grooming - Progress: Goal set today Pt Will Perform Upper Body Bathing: with modified independence;Sitting in shower ADL Goal: Upper Body Bathing - Progress: Goal set today Pt Will Perform Lower Body Bathing: with supervision;Sit to stand in shower ADL Goal: Lower Body Bathing - Progress: Goal set today Pt Will Perform Upper Body Dressing: Independently;Sitting, bed ADL Goal: Upper Body Dressing - Progress: Goal set today Pt Will Perform Lower Body Dressing: with modified  independence;Sit to stand from bed ADL Goal: Lower Body Dressing - Progress: Goal set today Pt Will Transfer to Toilet: with supervision;Ambulation;3-in-1 ADL Goal: Toilet Transfer - Progress: Goal set today Pt Will Perform Toileting - Hygiene: with modified independence;Standing at 3-in-1/toilet  (steady self with RW or grab bar) ADL Goal: Toileting - Hygiene - Progress: Goal set today Pt Will Perform Tub/Shower Transfer: Shower transfer;Ambulation;with DME;with supervision ADL Goal: Web designer - Progress: Goal set today Additional ADL Goal #1: Pt will I'ly verbalize and generalize 5/5 energy conservation techniques with ADLs and ADL mobility. ADL Goal: Additional Goal #1 - Progress: Goal set today  OT Evaluation Precautions/Restrictions  Precautions Precautions: Fall Restrictions Weight Bearing Restrictions: No Prior Functioning Home Living Lives With: Spouse Type of Home: Assisted living (apartment) Home Layout: One level Home Access: Level entry Bathroom Shower/Tub: Walk-in Contractor: Standard Bathroom Accessibility: Yes How Accessible: Accessible via wheelchair;Accessible via walker Home Adaptive Equipment: Built-in shower seat;Grab bars around toilet;Grab bars in shower;Straight cane Additional Comments: wife present 24hrs/day Prior Function Level of Independence: Requires assistive device for independence (uses cane at all times) Able to Take Stairs?: Reciprically Driving: No Vocation: Retired ADL ADL Eating/Feeding: Simulated;Independent Where Assessed - Eating/Feeding: Edge of bed Grooming: Performed;Wash/dry hands;Supervision/safety Where Assessed - Grooming: Standing at sink Upper Body Bathing: Set up;Supervision/safety;Simulated Where Assessed - Upper Body Bathing: Sitting, bed Lower Body Bathing: Simulated;Minimal assistance Where Assessed - Lower Body Bathing: Sit to stand from chair Upper Body Dressing: Performed;Supervision/safety;Set up Where Assessed - Upper Body Dressing: Sitting, bed Lower Body Dressing: Performed;Supervision/safety;Set up Lower Body Dressing Details (indicate cue type and reason): don socks Where Assessed - Lower Body Dressing: Sitting, bed Toilet Transfer: Performed;Minimal assistance Toilet  Transfer Details (indicate cue type and reason): Min A with RW ambulation to/from bathroom; Mod A for sit to stand from reg height toilet with grab bar and RW Toilet Transfer Method: Ambulating Toilet Transfer Equipment: Regular height toilet;Grab bars Toileting - Clothing Manipulation: Performed;Minimal assistance Where Assessed - Glass blower/designer Manipulation: Standing Toileting - Hygiene: Performed (Min guard A) Where Assessed - Toileting Hygiene: Standing Tub/Shower Transfer: Not assessed Equipment Used: Rolling walker Ambulation Related to ADLs: Pt Min A with RW ambulation. Cues to stay within RW and multiple rest breaks needed Vision/Perception  Vision - History Patient Visual Report: No change from baseline Vision - Assessment Vision Assessment: Vision not tested Cognition Cognition Arousal/Alertness: Awake/alert Overall Cognitive Status: History of cognitive impairments History of Cognitive Impairment: Appears at baseline functioning Orientation Level: Oriented to person;Oriented to place;Oriented to time Cognition - Other Comments: Decreased ability to discern PLOF from current function Sensation/Coordination Sensation Light Touch: Appears Intact Coordination Gross Motor Movements are Fluid and Coordinated: Yes Fine Motor Movements are Fluid and Coordinated: Yes Extremity Assessment RUE Assessment RUE Assessment: Within Functional Limits LUE Assessment LUE Assessment: Within Functional Limits Mobility  Bed Mobility Bed Mobility: No Transfers Sit to Stand: 4: Min assist;3: Mod assist;From toilet;From bed Sit to Stand Details (indicate cue type and reason): min from bed, mod from toilet Stand to Sit: To toilet;To chair/3-in-1;4: Min assist Stand to Sit Details: assist to control descent End of Session OT - End of Session Equipment Utilized During Treatment: Gait belt Activity Tolerance: Patient limited by fatigue Patient left: in chair;with call bell in  reach Nurse Communication: Mobility status for ambulation;Mobility status for transfers General Behavior During Session: [facility] for tasks performed Cognition: Impaired, at baseline   Tatsuya Okray 09/28/2011, 10:56 AM

## 2011-09-28 NOTE — Progress Notes (Signed)
Pt has a red area on his left hip. It looks as if it could be a stage 1 pressure ulcer. There is also signs of surgery in the past. The pt confirmed that he did have hip surgery before. In the middle of the red area there is a dark spot that may be hardware from the previous hip surgery. Pt was educated and informed to stay off the hip and why there was concern. Will continue to monitor.   Yulian Gosney [employer]

## 2011-09-28 NOTE — Progress Notes (Signed)
/  ARDIAC REHAB PHASE I   SR PRE:  Rate/Rhythm: 78 SR BP:  Supine: 90/42   Standing:    SaO2: 98 2L MODE:  Ambulation: 150   POST:  Rate/Rhythem: 79 BP:  Supine: 132/66  Standing:   SaO2: 97 2L 1430-1445 Assisted X 2 used O2 2l and walker to ambulate. Gait  Fairly steady with walker, does tire easily. VS stable. Back to bed after walk with call light in reach and sitter present.  Beatrix Fetters

## 2011-09-28 NOTE — Progress Notes (Signed)
Physical Therapy Evaluation Patient Details Name: [name] [name] MRN: [id number] DOB: Nov 10, 1924 Today's Date: 09/28/2011  Problem List:  Patient Active Problem List  Diagnoses  . DYSPNEA  . Ischemic cardiomyopathy  . CAD (coronary artery disease)  . Systolic HF (heart failure)  . NSTEMI (non-ST elevated myocardial infarction)  . Status post coronary artery stent placement  . Anemia  . Diabetes mellitus  . Hypothyroidism  . Dementia  . PAF (paroxysmal atrial fibrillation)  . Pyelonephritis    Past Medical History:  Past Medical History  Diagnosis Date  . Ischemic cardiomyopathy     EF 40 to 45%  . CAD (coronary artery disease)     with remote CABG in 1996, s/p PCI to SVG to OM in March 2012; repeat cath in September 2012 - managed medically  . Hx of CABG   . Hyperlipidemia   . V-tach     Previously on amiodarone  . Pulmonary fibrosis     Due to amiodarone  . PVD (peripheral vascular disease)     R CEA  . Hypothyroidism   . Insulin dependent diabetes mellitus   . H/O: lung cancer 2006    s/p resection and lymph node dissection  . Dementia   . Systolic HF (heart failure)   . NSTEMI (non-ST elevated myocardial infarction) 2012  . Supplemental oxygen dependent   . Status post coronary artery stent placement 11/2010    DES to SVG to OM  . PAF (paroxysmal atrial fibrillation)     Not a candidate for coumadin therapy  . Angina   . Shortness of breath   . Diabetes mellitus     Insulin dependent   Past Surgical History:  Past Surgical History  Procedure Date  . Coronary artery bypass graft 1996    x 5  . Des stent to vein graft to om March 2012  . Right cea   . Cardiac catheterization March 2012  . Lobectomy     PT Assessment/Plan/Recommendation PT Assessment Clinical Impression Statement: Pt presented with urinary retention and NSTEMI. Pt with some problem solving deficits, difficulty with transfers and balance who will benefit from therapy to maximize  function and decrease burden of care at discharge.  Pt HR 60-84 throughout session with O2 97-98 on 2L.  PT Recommendation/Assessment: Patient will need skilled PT in the acute care venue PT Problem List: Decreased balance;Decreased activity tolerance;Decreased safety awareness;Decreased knowledge of use of DME Barriers to Discharge: Decreased caregiver support Barriers to Discharge Comments: Unsure how much assist wife can provide at home PT Therapy Diagnosis : Difficulty walking;Abnormality of gait PT Plan PT Frequency: Min 3X/week PT Treatment/Interventions: Gait training;Functional mobility training;DME instruction;Therapeutic activities;Therapeutic exercise;Patient/family education PT Recommendation Follow Up Recommendations: Supervision for mobility/OOB;Home health PT;Skilled nursing facility (If wife unable to assist with transfers then SNF) Equipment Recommended: Rolling walker with 5" wheels PT Goals  Acute Rehab PT Goals PT Goal Formulation: With patient Time For Goal Achievement: 2 weeks Pt will go Supine/Side to Sit: with modified independence;with HOB 0 degrees PT Goal: Supine/Side to Sit - Progress: Goal set today Pt will go Sit to Supine/Side: with modified independence PT Goal: Sit to Supine/Side - Progress: Goal set today Pt will go Sit to Stand: with supervision PT Goal: Sit to Stand - Progress: Goal set today Pt will go Stand to Sit: with supervision PT Goal: Stand to Sit - Progress: Goal set today Pt will Ambulate: >150 feet;with supervision;with least restrictive assistive device PT Goal: Ambulate - Progress: Goal  set today  PT Evaluation Precautions/Restrictions  Precautions Precautions: Fall Restrictions Weight Bearing Restrictions: No Prior Functioning  Home Living Lives With: Spouse Type of Home: Assisted living (apartment) Home Layout: One level Home Access: Level entry Bathroom Shower/Tub: Walk-in Contractor: Standard Bathroom  Accessibility: Yes How Accessible: Accessible via wheelchair;Accessible via walker Home Adaptive Equipment: Built-in shower seat;Grab bars around toilet;Grab bars in shower;Straight cane Additional Comments: wife present 24hrs/day Prior Function Level of Independence: Requires assistive device for independence (uses cane at all times) Able to Take Stairs?: Reciprically Driving: No Vocation: Retired Financial risk analyst Arousal/Alertness: Awake/alert Overall Cognitive Status: History of cognitive impairments History of Cognitive Impairment: Appears at baseline functioning Orientation Level: Oriented to person;Oriented to place;Oriented to time Cognition - Other Comments: Decreased ability to discern PLOF from current function Sensation/Coordination Sensation Light Touch: Appears Intact Extremity Assessment RLE Assessment RLE Assessment: Within Functional Limits LLE Assessment LLE Assessment: Within Functional Limits Mobility (including Balance) Bed Mobility Bed Mobility: No Transfers Transfers: Yes Sit to Stand: 4: Min assist;3: Mod assist;From toilet;From bed Sit to Stand Details (indicate cue type and reason): min from bed, mod from toilet Stand to Sit: 5: Supervision;To toilet;To chair/3-in-1 Stand to Sit Details: cueing for hand placement and to control descent Ambulation/Gait Ambulation/Gait: Yes Ambulation/Gait Assistance: 4: Min assist Ambulation/Gait Assistance Details (indicate cue type and reason): min guard with constant VC and tactile cues to extend trunk and step into RW Ambulation Distance (Feet): 150 Feet Assistive device: Rolling walker Gait Pattern: Decreased stride length;Trunk flexed Stairs: No  Posture/Postural Control Posture/Postural Control: Postural limitations Postural Limitations: kyphotic posture Balance Balance Assessed: No (pt used cane PTA) Exercise    End of Session PT - End of Session Equipment Utilized During Treatment: Gait  belt Activity Tolerance: Patient tolerated treatment well Patient left: in chair;with call bell in reach Nurse Communication: Mobility status for transfers;Mobility status for ambulation General Behavior During Session: [facility] for tasks performed Cognition: Impaired, at baseline cosession with OT  Toney Sang Beth 09/28/2011, 9:44 AM Toney Sang, PT [phone]

## 2011-09-28 NOTE — Progress Notes (Signed)
Subjective:   Recovering from type II NSTEMI secondary to demand ischemia.  Denies chest pain.  Rhythm NSR with frequent PVCs [chronic]. echocardiogram not yet done. Echo to be done today.   Still slightly confused.  Denies any chest pain.  Will need to go to SNF instead of [place].      [name] aspirin  325 mg Oral Daily  . carbidopa-levodopa  1 tablet Oral BID WC  . ciprofloxacin  500 mg Oral BID  . clopidogrel  75 mg Oral Q breakfast  . donepezil  5 mg Oral QHS  . enoxaparin (LOVENOX) injection  40 mg Subcutaneous Q24H  . furosemide  40 mg Oral BID  . levothyroxine  125 mcg Oral QAC breakfast  . lisinopril  20 mg Oral Daily  . metoprolol succinate  25 mg Oral Daily  . rosuvastatin  40 mg Oral q1800  . Tamsulosin HCl  0.4 mg Oral QPC supper      Objective:  Vital Signs in the last 24 hours: Blood pressure 109/56, pulse 55, temperature 97.5 F (36.4 C), temperature source Oral, resp. rate 16, height 5' 11.5" (1.816 m), weight 173 lb 8 oz (78.7 kg), SpO2 100.00%. Temp:  [97.5 F (36.4 C)-98.2 F (36.8 C)] 97.5 F (36.4 C) (01/17 0437) Pulse Rate:  [53-56] 55  (01/17 0437) Resp:  [16-22] 16  (01/17 0437) BP: (93-109)/(38-56) 109/56 mmHg (01/17 0437) SpO2:  [98 %-100 %] 100 % (01/17 0437) FiO2 (%):  [2 %] 2 % (01/17 0437)  Intake/Output from previous day: 01/16 0701 - 01/17 0700 In: 480 [P.O.:480] Out: 1350 [Urine:1350] Intake/Output from this shift: Total I/O In: -  Out: 350 [Urine:350]  Physical Exam: The patient is alert and oriented x 3.  The mood and affect are normal.   Skin: warm and dry.  Color is normal.    HEENT:   the sclera are nonicteric.  The mucous membranes are moist.  The carotids are 2+ without bruits.  There is no thyromegaly.  There is no JVD.    Lungs: clear.  The chest wall is non tender.    Heart: regular rate with a normal S1 and S2.  There are no murmurs, gallops, or rubs. The PMI is not displaced.     Abdomen: good bowel sounds.  There is  no guarding or rebound.  There is no hepatosplenomegaly or tenderness.  There are no masses.   Extremities:  no clubbing, cyanosis, or edema.  The legs are without rashes.  The distal pulses are intact.   Neuro:  Cranial nerves II - XII are intact.  Motor and sensory functions are intact.     Lab Results:  [facility] 09/28/11 0633 09/27/11 0550  WBC 8.2 7.3  HGB 11.0* 11.3*  PLT 218 223    Basename 09/26/11 0815  NA 140  K 4.4  CL 103  CO2 27  GLUCOSE 169*  BUN 14  CREATININE 0.91    Basename 09/27/11 1615 09/25/11 1400  TROPONINI 0.74* 1.69*   No results found for this basename: BNP in the last 72 hours Hepatic Function Panel No results found for this basename: PROT,ALBUMIN,AST,ALT,ALKPHOS,BILITOT,BILIDIR,IBILI in the last 72 hours Lab Results  Component Value Date   CHOL 296* 09/25/2011   HDL 24* 09/25/2011   LDLCALC 207* 09/25/2011   TRIG 324* 09/25/2011   CHOLHDL 12.3 09/25/2011   No results found for this basename: INR in the last 72 hours  Tele:  NSR  Assessment/Plan:  NSTEMI (non-ST elevated myocardial infarction) ()  continue conservative care. No new cardiac recs.  BP and HR are well controlled.  Systolic HF (heart failure) ()   LV EF is mildly depressed by echo.  Change foley cath to condom cath per nurse request.  Disposition: To SNF when able to go.  Vesta Mixer, Montez Hageman., MD, [facility] 09/28/2011, 7:33 AM LOS: Day 4

## 2011-09-28 NOTE — Progress Notes (Signed)
Clinical Social Worker completed the psychosocial assessment, which can be found in the shadow chart. Patient and family interested in [place] skilled nursing facility for rehab. Mrs. [name] [name]'s ph # is [phone]. FL-2 completed and made available to MD for signature. Skilled facility bed search initiated and family will be advised of bed offers.   Genelle Bal, MSW, LCSW [phone]

## 2011-09-28 NOTE — Progress Notes (Signed)
Subjective: No chest pain; no SOB. Denies abdominal pain, diarrhea or any other acute complaint. No fever. Patient able to urinate without Foley; using just a condom catheter.  Patient lost his IV started this morning; at this point antibiotics has been transitioned to by mouth. Objective: Vital signs in last 24 hours: Temp:  [97.5 F (36.4 C)-97.9 F (36.6 C)] 97.9 F (36.6 C) (01/17 1537) Pulse Rate:  [40-55] 40  (01/17 1537) Resp:  [16-18] 18  (01/17 1537) BP: (93-115)/(38-58) 115/58 mmHg (01/17 1537) SpO2:  [98 %-100 %] 98 % (01/17 1537) FiO2 (%):  [2 %] 2 % (01/17 0437) Weight change:  Last BM Date: 09/27/11  Intake/Output from previous day: 01/16 0701 - 01/17 0700 In: 480 [P.O.:480] Out: 1350 [Urine:1350] Total I/O In: 360 [P.O.:360] Out: 851 [Urine:850; Stool:1]   Physical Exam: General: Alert, awake, oriented x1-2 (intermittently), in no acute distress. Mentation at baseline due to dementia, according to family and the staff. HEENT: No bruits, no goiter. Heart: Regular rate, without murmurs, rubs or gallops. Lungs: Clear to auscultation bilaterally. Abdomen: Soft, nontender, nondistended, positive bowel sounds. Extremities: No clubbing cyanosis or edema with positive pedal pulses. Neuro: Grossly intact, nonfocal.   Lab Results: Basic Metabolic Panel:  [facility] 09/26/11 0815  NA 140  K 4.4  CL 103  CO2 27  GLUCOSE 169*  BUN 14  CREATININE 0.91  CALCIUM 9.0  MG --  PHOS --   CBC:  Basename 09/28/11 0633 09/27/11 0550  WBC 8.2 7.3  NEUTROABS -- --  HGB 11.0* 11.3*  HCT 34.6* 35.0*  MCV 86.1 85.4  PLT 218 223   Cardiac Enzymes:  Basename 09/27/11 1615  CKTOTAL 21  CKMB 1.7  CKMBINDEX --  TROPONINI 0.74*    Misc. Labs:  Recent Results (from the past 240 hour(s))  CULTURE, BLOOD (ROUTINE X 2)     Status: Normal (Preliminary result)   Collection Time   09/24/11  7:29 PM      Component Value Range Status Comment   Specimen Description  BLOOD LEFT ARM   Final    Special Requests BOTTLES DRAWN AEROBIC ONLY 4CC   Final    Setup Time [id number]   Final    Culture     Final    Value:        BLOOD CULTURE RECEIVED NO GROWTH TO DATE CULTURE WILL BE HELD FOR 5 DAYS BEFORE ISSUING A FINAL NEGATIVE REPORT   Report Status PENDING   Incomplete   CULTURE, BLOOD (ROUTINE X 2)     Status: Normal (Preliminary result)   Collection Time   09/24/11  7:56 PM      Component Value Range Status Comment   Specimen Description BLOOD RIGHT HAND   Final    Special Requests BOTTLES DRAWN AEROBIC ONLY 4CC   Final    Setup Time [id number]   Final    Culture     Final    Value:        BLOOD CULTURE RECEIVED NO GROWTH TO DATE CULTURE WILL BE HELD FOR 5 DAYS BEFORE ISSUING A FINAL NEGATIVE REPORT   Report Status PENDING   Incomplete   URINE CULTURE     Status: Normal   Collection Time   09/24/11  8:22 PM      Component Value Range Status Comment   Specimen Description URINE, CATHETERIZED   Final    Special Requests NONE   Final    Setup Time [id number]   Final  Colony Count NO GROWTH   Final    Culture NO GROWTH   Final    Report Status 09/26/2011 FINAL   Final   MRSA PCR SCREENING     Status: Normal   Collection Time   09/25/11 10:31 AM      Component Value Range Status Comment   MRSA by PCR NEGATIVE  NEGATIVE  Final     Studies/Results: No results found.  Medications: Scheduled Meds:    . aspirin  325 mg Oral Daily  . carbidopa-levodopa  1 tablet Oral BID WC  . ciprofloxacin  500 mg Oral BID  . clopidogrel  75 mg Oral Q breakfast  . donepezil  5 mg Oral QHS  . enoxaparin (LOVENOX) injection  40 mg Subcutaneous Q24H  . furosemide  40 mg Oral BID  . levothyroxine  125 mcg Oral QAC breakfast  . lisinopril  20 mg Oral Daily  . metoprolol succinate  25 mg Oral Daily  . rosuvastatin  40 mg Oral q1800  . Tamsulosin HCl  0.4 mg Oral QPC supper   Continuous Infusions:  PRN Meds:.nitroGLYCERIN  Assessment/Plan: 1-NSTEMI  (non-ST elevated myocardial infarction): Will continue aspirin, Plavix, and also the use of statins, beta blockers and ACE inhibitors. At this point no further workup per cardiology as an inpatient; will continue conservative management. Patient without any chest pain currently. Per PT/OT evaluation patient will require SNF placement.  2-Ischemic cardiomyopathy: Treatment as mentioned in problem #1. No further workup indicated. [name] 3-Systolic HF (heart failure): Compensated; continue Lasix and heart healthy diet.  4-Diabetes mellitus: diet control. Blood glucose in the 120's to 140's without medications. Continue monitoring.  5-Hypothyroidism: continue Levothyroxine  6-Dementia: continue Aricept and general support  7-PAF (paroxysmal atrial fibrillation): Continue B-blocker. Not a candidate for Coumadin.  8-Pyelonephritis: continue Ciprofloxacin twice a day for 10 more doses.  9-urinary retention: continue Flomax. Foley successfully removed and patient able to by mouth his own.  Disposition: At this point waiting placement.   LOS: 4 days   Nnaemeka Samson [facility] [phone]  09/28/2011, 5:39 PM

## 2011-09-29 DIAGNOSIS — I2510 Atherosclerotic heart disease of native coronary artery without angina pectoris: Secondary | ICD-10-CM

## 2011-09-29 LAB — CBC
HCT: 36.4 % — ABNORMAL LOW (ref 39.0–52.0)
MCHC: 32.1 g/dL (ref 30.0–36.0)
RDW: 15 % (ref 11.5–15.5)

## 2011-09-29 MED ORDER — TAMSULOSIN HCL 0.4 MG PO CAPS: 0.4000 mg | ORAL_CAPSULE | Freq: Every day | ORAL | Status: DC

## 2011-09-29 MED ORDER — ROSUVASTATIN CALCIUM 40 MG PO TABS: 40.0000 mg | ORAL_TABLET | Freq: Every day | ORAL | Status: DC

## 2011-09-29 MED ORDER — CARBIDOPA-LEVODOPA CR 25-100 MG PO TBCR: 1.0000 | EXTENDED_RELEASE_TABLET | Freq: Two times a day (BID) | ORAL | Status: DC

## 2011-09-29 MED ORDER — CIPROFLOXACIN HCL 500 MG PO TABS: 500.0000 mg | ORAL_TABLET | Freq: Two times a day (BID) | ORAL | Status: AC

## 2011-09-29 MED ORDER — ASPIRIN 325 MG PO TABS: 325.0000 mg | ORAL_TABLET | Freq: Every day | ORAL | Status: DC

## 2011-09-29 NOTE — Progress Notes (Signed)
Subjective:   Recovering from type II NSTEMI secondary to demand ischemia.  Denies chest pain.  Rhythm NSR with frequent PVCs [chronic].  Still slightly confused.  Denies any chest pain.  Will need to go to SNF instead of [facility].      [name] aspirin  325 mg Oral Daily  . carbidopa-levodopa  1 tablet Oral BID WC  . ciprofloxacin  500 mg Oral BID  . clopidogrel  75 mg Oral Q breakfast  . donepezil  5 mg Oral QHS  . enoxaparin (LOVENOX) injection  40 mg Subcutaneous Q24H  . furosemide  40 mg Oral BID  . levothyroxine  125 mcg Oral QAC breakfast  . lisinopril  20 mg Oral Daily  . metoprolol succinate  25 mg Oral Daily  . rosuvastatin  40 mg Oral q1800  . Tamsulosin HCl  0.4 mg Oral QPC supper      Objective:  Vital Signs in the last 24 hours: Blood pressure 147/65, pulse 55, temperature 98 F (36.7 C), temperature source Oral, resp. rate 20, height 5' 11.5" (1.816 m), weight 175 lb 7.8 oz (79.6 kg), SpO2 97.00%. Temp:  [97.6 F (36.4 C)-98 F (36.7 C)] 98 F (36.7 C) (01/18 0502) Pulse Rate:  [40-68] 55  (01/18 0502) Resp:  [18-24] 20  (01/18 0502) BP: (108-147)/(58-67) 147/65 mmHg (01/18 0502) SpO2:  [94 %-98 %] 97 % (01/18 0502) Weight:  [175 lb 7.8 oz (79.6 kg)] 175 lb 7.8 oz (79.6 kg) (01/18 0502)  Intake/Output from previous day: 01/17 0701 - 01/18 0700 In: 360 [P.O.:360] Out: 1026 [Urine:1025; Stool:1] Intake/Output from this shift: Total I/O In: -  Out: 200 [Urine:200]  Physical Exam: The patient is alert and oriented x 3.  The mood and affect are normal.   Skin: warm and dry.  Color is normal.    HEENT:   the sclera are nonicteric.  The mucous membranes are moist.  The carotids are 2+ without bruits.  There is no thyromegaly.  There is no JVD.    Lungs: clear.  The chest wall is non tender.    Heart: regular rate with a normal S1 and S2.  There are no murmurs, gallops, or rubs. The PMI is not displaced.     Abdomen: good bowel sounds.  There is no guarding  or rebound.  There is no hepatosplenomegaly or tenderness.  There are no masses.   Extremities:  no clubbing, cyanosis, or edema.  The legs are without rashes.  The distal pulses are intact.   Neuro:  Cranial nerves II - XII are intact.  Motor and sensory functions are intact.     Lab Results:  Basename 09/29/11 0515 09/28/11 0633  WBC 8.6 8.2  HGB 11.7* 11.0*  PLT 214 218   No results found for this basename: NA:2,K:2,CL:2,CO2:2,GLUCOSE:2,BUN:2,CREATININE:2 in the last 72 hours  Basename 09/27/11 1615  TROPONINI 0.74*   No results found for this basename: BNP in the last 72 hours Hepatic Function Panel No results found for this basename: PROT,ALBUMIN,AST,ALT,ALKPHOS,BILITOT,BILIDIR,IBILI in the last 72 hours Lab Results  Component Value Date   CHOL 296* 09/25/2011   HDL 24* 09/25/2011   LDLCALC 207* 09/25/2011   TRIG 324* 09/25/2011   CHOLHDL 12.3 09/25/2011   No results found for this basename: INR in the last 72 hours  Tele:  Not on tele  Assessment/Plan:   NSTEMI (non-ST elevated myocardial infarction) ()  continue conservative care. No new cardiac recs.  BP and HR are well  controlled.  He has known CAD and the decision has been made to manage this medically  Systolic HF (heart failure) ()   LV EF is mildly depressed by echo.  Change foley cath to condom cath per nurse request.  Disposition: To SNF when able to go.  We will sign off.  The patient will return to see Dr. Patty Sermons as an outpatient.  Vesta Mixer, Montez Hageman., MD, [facility] 09/29/2011, 9:12 AM LOS: Day 5

## 2011-09-29 NOTE — Progress Notes (Signed)
Mr. Berrios has been discharged to [facility] skilled nursing facility today for short-term rehab. Discharge information sent to facility. CSW facilitated transportation to facility via ambulance. Family aware of discharge.  Genelle Bal, MSW, LCSW [phone]

## 2011-09-29 NOTE — Discharge Summary (Signed)
Physician Discharge Summary  Patient ID: [name] [name] MRN: [id number] DOB/AGE: [birth year] 78 y.o.  Admit date: 09/24/2011 Discharge date: 09/29/2011  Primary Care Physician:  Kirstie Peri, MD, MD   Discharge Diagnoses:   1-NSTEMI 2-Pyelonephritis 3-[place]rinay retention  4-Prostatism 5-Dementia 6-HTN 8-HLD 9-DM 10-Hypothyroidism 11-Physical deconditioning.   Current Discharge Medication List    START taking these medications   Details  carbidopa-levodopa (SINEMET CR) 25-100 MG per tablet Take 1 tablet by mouth 2 (two) times daily with a meal.    ciprofloxacin (CIPRO) 500 MG tablet Take 1 tablet (500 mg total) by mouth 2 (two) times daily.    Tamsulosin HCl (FLOMAX) 0.4 MG CAPS Take 1 capsule (0.4 mg total) by mouth daily after supper. Qty: 30 capsule      CONTINUE these medications which have CHANGED   Details  aspirin 325 MG tablet Take 1 tablet (325 mg total) by mouth daily.    rosuvastatin (CRESTOR) 40 MG tablet Take 1 tablet (40 mg total) by mouth daily at 6 PM.      CONTINUE these medications which have NOT CHANGED   Details  donepezil (ARICEPT) 10 MG tablet Take one by mouth daily     furosemide (LASIX) 40 MG tablet TAKE ONE TABLET BY MOUTH TWICE DAILY Qty: 180 tablet, Refills: 3    gabapentin (NEURONTIN) 300 MG capsule Take 300 mg by mouth daily.      glipiZIDE (GLUCOTROL) 5 MG tablet Take 5 mg by mouth daily. Take one by mouth daily     isosorbide mononitrate (IMDUR) 30 MG 24 hr tablet Take 30 mg by mouth daily. Take one by mouth daily     KLOR-CON M20 20 MEQ tablet Take 20 mEq by mouth daily.     levothyroxine (SYNTHROID, LEVOTHROID) 137 MCG tablet Take 125 mcg by mouth daily.     lisinopril (PRINIVIL,ZESTRIL) 20 MG tablet Take 20 mg by mouth daily.      metFORMIN (GLUCOPHAGE) 500 MG tablet Take two by mouth twice daily     metoprolol succinate (TOPROL-XL) 25 MG 24 hr tablet Take 25 mg by mouth daily.      nitroGLYCERIN (NITROSTAT) 0.4 MG SL tablet  Place 0.4 mg under the tongue every 5 (five) minutes as needed. For chest pain    PLAVIX 75 MG tablet Take 75 mg by mouth daily. Take one by mouth daily     OXYGEN-HELIUM IN Inhale into the lungs. 3L via [place] continuous       STOP taking these medications     Cinnamon 500 MG capsule          Disposition and Follow-up:  Patient has been discharged in a stable and improved condition. Currently no having any dysuria, any fever, any abdominal pain, no chest pain or shortness of breath. At this point patient will continue conservative management for his NSTEMI, using stockings, aspirin, Plavix, beta blockers and ACE inhibitors. Patient will follow with Dr. Patty Sermons [facility] cardiology) as an outpatient. He will take ciprofloxacin for 4 more days to complete treatment for his pyelonephritis. Patient has been advised to follow discharge instructions and to take his medications as prescribed. A followup visit with PCP has been recommended over the next 2 weeks.  Consults:   [facility] Cardiology  Significant Diagnostic Studies:  Portable Chest Xray  09/24/2011  *RADIOLOGY REPORT*  Clinical Data: Chest pain and shortness of breath; vascular congestion.  PORTABLE CHEST - 1 VIEW  Comparison: Chest radiograph performed 06/07/2011  Findings: The lungs are well-aerated.  Increased density of the left hemithorax may reflect a small left pleural effusion. Underlying airspace opacity may reflect atelectasis or possibly pneumonia.  Vascular congestion is noted; increased interstitial markings raise concern for mild interstitial edema.  No pneumothorax is seen.  The cardiomediastinal silhouette is borderline normal in size; the patient is status post median sternotomy.  No acute osseous abnormalities are seen.  IMPRESSION: Vascular congestion; increased interstitial markings may reflect mild interstitial edema.  Superimposed pneumonia cannot be excluded.  Likely small left pleural effusion noted.  Original Report  Authenticated By: Tonia Ghent, M.D.    Brief H and P: For complete details please refer to admission H and P, but in brief 78 y/o pleasant man with a Hx of Alzheimer disease and prostatism, presented to [facility] hospital on 1/13 for urinary retention. Was treated in ED with Foley Cath insertion and discharged. However, the patient returned to ED 2 weeks later with acute abdominal pain. At that time, the patient was found to have "dirty" urine and pyelonephritic straining per CT of abdomen and was admitted to [facility]. Subsequently, he developed CP with an increase in troponin from 0.5 up to 5. Cardiology consult started the patient on a heparin gtt for NSTEMI and patient was transferred to [facility] for further evaluation and care.   Hospital Course:  1-NSTEMI (non-ST elevated myocardial infarction): Will continue aspirin, Plavix, and also the use of statins, beta blockers and ACE inhibitors. At this point no further workup per cardiology is recommended and the plan is for medical/conservative management. Patient without chest pain. Will follow with Dr. Patty Sermons as an outpatient.  2-Ischemic cardiomyopathy: Treatment as mentioned in problem #1.  [name]  3-Systolic HF (heart failure): Compensated; continue Lasix and heart healthy diet.   4-Diabetes mellitus: Resume glipizide and metformin. Further adjustment/ recommendations per PCP. 5-Hypothyroidism: continue Levothyroxine   6-Dementia: continue Aricept and supportive care.  7-PAF (paroxysmal atrial fibrillation): Continue B-blocker. Not a candidate for Coumadin.   8-Pyelonephritis: continue Ciprofloxacin twice a day for 8 more doses.   9-urinary retention: Resolved; continue Flomax and follow with urology as an outpatient.  10-Physical deconditioning: To follow PT/OT rec's at next venue for physical rehab.   Time spent on Discharge: 40 minutes  Signed: Marilee Ditommaso 09/29/2011, 2:50 PM

## 2011-09-29 NOTE — Progress Notes (Signed)
Patient discharge to SNF via (ambulance or private vehicle) 09/29/2011 4:53 PM. All necessary paperwork sent with patient to facility. [name] [name]

## 2011-09-29 NOTE — Progress Notes (Signed)
0945 Cardiac Rehab Pt is now no longer on teletremy . We do follow patients when teletremy has been d/c.

## 2011-10-25 ENCOUNTER — Encounter: Payer: Self-pay | Admitting: Cardiology

## 2011-10-25 ENCOUNTER — Ambulatory Visit (INDEPENDENT_AMBULATORY_CARE_PROVIDER_SITE_OTHER): Payer: Medicare Other | Admitting: Cardiology

## 2011-10-25 VITALS — BP 90/50 | Ht 71.0 in | Wt 180.0 lb

## 2011-10-25 DIAGNOSIS — R0600 Dyspnea, unspecified: Secondary | ICD-10-CM

## 2011-10-25 DIAGNOSIS — R0602 Shortness of breath: Secondary | ICD-10-CM

## 2011-10-25 DIAGNOSIS — R0609 Other forms of dyspnea: Secondary | ICD-10-CM

## 2011-10-25 DIAGNOSIS — E039 Hypothyroidism, unspecified: Secondary | ICD-10-CM

## 2011-10-25 DIAGNOSIS — R0989 Other specified symptoms and signs involving the circulatory and respiratory systems: Secondary | ICD-10-CM

## 2011-10-25 DIAGNOSIS — I4891 Unspecified atrial fibrillation: Secondary | ICD-10-CM

## 2011-10-25 DIAGNOSIS — E119 Type 2 diabetes mellitus without complications: Secondary | ICD-10-CM

## 2011-10-25 DIAGNOSIS — I5020 Unspecified systolic (congestive) heart failure: Secondary | ICD-10-CM

## 2011-10-25 DIAGNOSIS — I2510 Atherosclerotic heart disease of native coronary artery without angina pectoris: Secondary | ICD-10-CM

## 2011-10-25 DIAGNOSIS — I480 Paroxysmal atrial fibrillation: Secondary | ICD-10-CM

## 2011-10-25 MED ORDER — LEVOTHYROXINE SODIUM 137 MCG PO TABS: 125.0000 ug | ORAL_TABLET | Freq: Every day | ORAL | Status: AC

## 2011-10-25 NOTE — Patient Instructions (Signed)
Decrease your Lasix (furosemide) to 40 mg once a day Your physician recommends that you schedule a follow-up appointment in: 2 months ov/ekg/bmet

## 2011-10-25 NOTE — Assessment & Plan Note (Signed)
The patient is comfortable at rest on oxygen but is very dyspneic with minimal activity.  He has a dry nonproductive cough.  He's had no fever or purulent sputum

## 2011-10-25 NOTE — Assessment & Plan Note (Signed)
The patient has been on furosemide 40 mg twice a day.  He has been experiencing some lightheadedness and his blood pressure today is low at 90/50.  We will reduce his furosemide to just 40 mg once a day.  If he begins to have a lot of swelling or weight gain he will have to go back to the higher dose.

## 2011-10-25 NOTE — Assessment & Plan Note (Signed)
The patient has not been experiencing any recent chest pain or angina.  He has not had to take any recent sublingual nitroglycerin.

## 2011-10-25 NOTE — Progress Notes (Signed)
[name] [name] Date of Birth:  09/15/1924 [facility] [address] [place], [place]  [address] [phone]         Fax   [phone]  History of Present Illness: This pleasant 78 year old gentleman is seen for a scheduled followup office visit.  He has a history of ischemic heart disease with remote coronary artery bypass graft surgery in 1996.  He had a stent to the saphenous vein graft to the obtuse marginal in March of 2012.  He has a history of paroxysmal atrial fibrillation.  He cannot be on Coumadin because of poor balance, previous falls, and multiple comorbidities.  He does have significant dementia.  He lives with his wife in a retirement center.  In September 2012 he was readmitted and had cardiac catheterization with Dr. Riley Kill and there were several potential culprit lesions for chest pain but no obvious culprit lesions it was felt that he would be best managed medically.  His ejection fraction is 40-45%.  The patient has severe COPD as well as pulmonary fibrosis and is on round-the-clock oxygen.  Current Outpatient Prescriptions  Medication Sig Dispense Refill  . aspirin 325 MG tablet Take 1 tablet (325 mg total) by mouth daily.      . carbidopa-levodopa (SINEMET CR) 25-100 MG per tablet Take 1 tablet by mouth 2 (two) times daily with a meal.      . donepezil (ARICEPT) 10 MG tablet Take one by mouth daily       . furosemide (LASIX) 40 MG tablet       . gabapentin (NEURONTIN) 300 MG capsule Take 300 mg by mouth daily.        [name] glipiZIDE (GLUCOTROL) 5 MG tablet Take 5 mg by mouth daily. Take one by mouth daily       . isosorbide mononitrate (IMDUR) 30 MG 24 hr tablet Take 30 mg by mouth daily. Take one by mouth daily       . KLOR-CON M20 20 MEQ tablet Take 20 mEq by mouth daily.       [name] levothyroxine (SYNTHROID, LEVOTHROID) 137 MCG tablet Take 1 tablet (137 mcg total) by mouth daily.  90 tablet  3  . lisinopril (PRINIVIL,ZESTRIL) 20 MG tablet Take 20 mg by  mouth daily.        . metFORMIN (GLUCOPHAGE) 500 MG tablet Take two by mouth twice daily       . metoprolol succinate (TOPROL-XL) 25 MG 24 hr tablet Take 25 mg by mouth daily.        . nitroGLYCERIN (NITROSTAT) 0.4 MG SL tablet Place 0.4 mg under the tongue every 5 (five) minutes as needed. For chest pain      . OXYGEN-HELIUM IN Inhale into the lungs. 3L via [place] continuous       . PLAVIX 75 MG tablet Take 75 mg by mouth daily. Take one by mouth daily       . rosuvastatin (CRESTOR) 40 MG tablet Take 1 tablet (40 mg total) by mouth daily at 6 PM.      . Tamsulosin HCl (FLOMAX) 0.4 MG CAPS Take 1 capsule (0.4 mg total) by mouth daily after supper.  30 capsule    . DISCONTD: furosemide (LASIX) 40 MG tablet TAKE ONE TABLET BY MOUTH TWICE DAILY  180 tablet  3  . DISCONTD: levothyroxine (SYNTHROID, LEVOTHROID) 137 MCG tablet Take 125 mcg by mouth daily.       [name] albuterol (PROVENTIL) (2.5 MG/3ML) 0.083% nebulizer solution as  directed.      [name] SPIRIVA HANDIHALER 18 MCG inhalation capsule 18 mcg as directed.        Allergies  Allergen Reactions  . Amiodarone Rash  . Morphine And Related Itching and Rash    Patient Active Problem List  Diagnoses  . DYSPNEA  . Ischemic cardiomyopathy  . CAD (coronary artery disease)  . Systolic HF (heart failure)  . NSTEMI (non-ST elevated myocardial infarction)  . Status post coronary artery stent placement  . Anemia  . Diabetes mellitus  . Hypothyroidism  . Dementia  . PAF (paroxysmal atrial fibrillation)  . Pyelonephritis    History  Smoking status  . Former Smoker  . Quit date: 09/11/1994  Smokeless tobacco  . Never Used    History  Alcohol Use No    No family history on file.  Review of Systems: Constitutional: no fever chills diaphoresis or fatigue or change in weight.  Head and neck: no hearing loss, no epistaxis, no photophobia or visual disturbance. Respiratory: No cough, shortness of breath or wheezing. Cardiovascular: No chest pain  peripheral edema, palpitations. Gastrointestinal: No abdominal distention, no abdominal pain, no change in bowel habits hematochezia or melena. Genitourinary: No dysuria, no frequency, no urgency, no nocturia. Musculoskeletal:No arthralgias, no back pain, no gait disturbance or myalgias. Neurological: No dizziness, no headaches, no numbness, no seizures, no syncope, no weakness, no tremors. Hematologic: No lymphadenopathy, no easy bruising. Psychiatric: No confusion, no hallucinations, no sleep disturbance.    Physical Exam: Filed Vitals:   10/25/11 1003  BP: 90/50   The general appearance reveals an elderly pleasantly demented gentleman in no acute distress.  He does have peripheral oxygen with nasal cannula.Pupils equal and reactive.   Extraocular Movements are full.  There is no scleral icterus.  The mouth and pharynx are normal.  The neck is supple.  The carotids reveal no bruits.  The jugular venous pressure is normal.  The thyroid is not enlarged.  There is no lymphadenopathy.  The chest reveals scattered rhonchi.The precordium is quiet.  The first heart sound is normal.  The second heart sound is physiologically split.  There is no murmur gallop rub or click.  There is no abnormal lift or heave.  Rhythm is irregular consistent with atrial fibrillation.  We did not do an EKG today.The abdomen is soft and nontender. Bowel sounds are normal. The liver and spleen are not enlarged. There Are no abdominal masses. There are no bruits.  The pedal pulses are good.  There is no phlebitis or edema.  There is no cyanosis or clubbing. Neurologic reveals moves all extremities well.  He does have decreased short-term memory capacity 9 temp    Assessment / Plan: Continue same medication except decrease furosemide to 40 mg once a day.  Recheck in 2 months for office visit basal metabolic panel and EKG.

## 2011-10-25 NOTE — Assessment & Plan Note (Signed)
The patient has not been experiencing any hypoglycemic episodes recently.

## 2011-11-22 DIAGNOSIS — I4891 Unspecified atrial fibrillation: Secondary | ICD-10-CM

## 2011-11-23 DIAGNOSIS — J984 Other disorders of lung: Secondary | ICD-10-CM

## 2011-12-11 ENCOUNTER — Ambulatory Visit (INDEPENDENT_AMBULATORY_CARE_PROVIDER_SITE_OTHER): Payer: Medicare Other | Admitting: Cardiology

## 2011-12-11 ENCOUNTER — Encounter: Payer: Self-pay | Admitting: Cardiology

## 2011-12-11 ENCOUNTER — Encounter: Payer: Medicare Other | Admitting: Nurse Practitioner

## 2011-12-11 DIAGNOSIS — R079 Chest pain, unspecified: Secondary | ICD-10-CM

## 2011-12-11 DIAGNOSIS — I5020 Unspecified systolic (congestive) heart failure: Secondary | ICD-10-CM

## 2011-12-11 DIAGNOSIS — R0602 Shortness of breath: Secondary | ICD-10-CM

## 2011-12-11 DIAGNOSIS — F0390 Unspecified dementia without behavioral disturbance: Secondary | ICD-10-CM

## 2011-12-11 MED ORDER — FUROSEMIDE 20 MG PO TABS: 20.0000 mg | ORAL_TABLET | Freq: Every day | ORAL | Status: DC

## 2011-12-11 NOTE — Assessment & Plan Note (Signed)
The patient has chronic dyspnea and is on oxygen 24/7.  Today his breathing seems back to baseline.  He is not having any fever chills or evidence of acute bronchitis

## 2011-12-11 NOTE — Assessment & Plan Note (Signed)
The patient has severe dementia.  His wife comes with him today.  She is no longer able to care for him in the assisted living apartment and so he is at the [facility] and is a skilled nursing facility patient.  His wife mentions that the patient has been having symptomatic hemorrhoids with some mild bleeding.  He himself is not able to give much of a history concerning that.

## 2011-12-11 NOTE — Progress Notes (Signed)
[name] [name] Date of Birth:  08-Jun-1925 [facility] [address] [place], [place]  [address] [phone]         Fax   [phone]  History of Present Illness: This pleasant 78 year old gentleman is seen for a posthospital office visit.  The patient was hospitalized on 11/22/2011 with chest pain and his family was told that he had had a myocardial infarction.  He had severe respiratory difficulty and spent one week in the ICU at [facility].  He did not have to go on a ventilator however.  The patient is oxygen dependent.  He has a past history of severe pulmonary fibrosis felt to be secondary to remote usage of amiodarone.  He has been in chronic atrial fibrillation.  He is not a candidate for Coumadin because of comorbidities and be a fall risk he has a history of dementia, diabetes mellitus, ischemic heart disease, and systolic heart failure.  He had coronary artery bypass graft surgery in 1996.  He had recurrent chest pain in March 2012 and underwent stenting of the saphenous vein graft to the obtuse marginal.  He has been in atrial fibrillation since September 2012.  He was readmitted with chest pain in late September 2012 and had cardiac catheterization but there was no obvious culprit lesion and it was felt best not to intervene.  Current Outpatient Prescriptions  Medication Sig Dispense Refill  . albuterol (PROVENTIL) (2.5 MG/3ML) 0.083% nebulizer solution as directed.      [name] aspirin 81 MG tablet Take 81 mg by mouth daily.      [name] atorvastatin (LIPITOR) 80 MG tablet Take 80 mg by mouth daily.      [name] glipiZIDE (GLUCOTROL) 5 MG tablet Take 5 mg by mouth daily. Take one by mouth daily       . isosorbide mononitrate (IMDUR) 30 MG 24 hr tablet Take 30 mg by mouth daily. Take one by mouth daily       . levothyroxine (SYNTHROID, LEVOTHROID) 137 MCG tablet Take 1 tablet (137 mcg total) by mouth daily.  90 tablet  3  . LORazepam (ATIVAN) 0.5 MG tablet Take  0.5 mg by mouth every 6 (six) hours as needed.      . metFORMIN (GLUCOPHAGE) 500 MG tablet Take two by mouth twice daily       . metoprolol succinate (TOPROL-XL) 25 MG 24 hr tablet Take 25 mg by mouth daily.        . nitroGLYCERIN (NITROSTAT) 0.4 MG SL tablet Place 0.4 mg under the tongue every 5 (five) minutes as needed. For chest pain      . OXYGEN-HELIUM IN Inhale into the lungs. 3L via [place] continuous       . PLAVIX 75 MG tablet Take 75 mg by mouth daily. Take one by mouth daily       . rosuvastatin (CRESTOR) 40 MG tablet Take 1 tablet (40 mg total) by mouth daily at 6 PM.      . SPIRIVA HANDIHALER 18 MCG inhalation capsule 18 mcg as directed.      . furosemide (LASIX) 20 MG tablet Take 1 tablet (20 mg total) by mouth daily.  30 tablet  11  . gabapentin (NEURONTIN) 300 MG capsule Take 300 mg by mouth daily.          Allergies  Allergen Reactions  . Amiodarone Rash  . Morphine And Related Itching and Rash    Patient Active Problem List  Diagnoses  .  DYSPNEA  . Ischemic cardiomyopathy  . CAD (coronary artery disease)  . Systolic HF (heart failure)  . NSTEMI (non-ST elevated myocardial infarction)  . Status post coronary artery stent placement  . Anemia  . Diabetes mellitus  . Hypothyroidism  . Dementia  . PAF (paroxysmal atrial fibrillation)  . Pyelonephritis    History  Smoking status  . Former Smoker  . Quit date: 09/11/1994  Smokeless tobacco  . Never Used    History  Alcohol Use No    No family history on file.  Review of Systems: Constitutional: no fever chills diaphoresis or fatigue or change in weight.  Head and neck: no hearing loss, no epistaxis, no photophobia or visual disturbance. Respiratory: No cough, shortness of breath or wheezing. Cardiovascular: No chest pain peripheral edema, palpitations. Gastrointestinal: No abdominal distention, no abdominal pain, no change in bowel habits hematochezia or melena. Genitourinary: No dysuria, no frequency, no  urgency, no nocturia. Musculoskeletal:No arthralgias, no back pain, no gait disturbance or myalgias. Neurological: No dizziness, no headaches, no numbness, no seizures, no syncope, no weakness, no tremors. Hematologic: No lymphadenopathy, no easy bruising. Psychiatric: No confusion, no hallucinations, no sleep disturbance.    Physical Exam: Filed Vitals:   12/11/11 1543  BP: 70/30  Pulse: 96   the general appearance reveals a elderly pleasant gentleman who is demented.Pupils equal and reactive.   Extraocular Movements are full.  There is no scleral icterus.  The mouth and pharynx are normal.  The neck is supple.  The carotids reveal no bruits.  The jugular venous pressure is normal.  The thyroid is not enlarged.  There is no lymphadenopathy.  Chest is clear.  The heart reveals an irregular rhythm without murmur gallop or rub.The abdomen is soft and nontender. Bowel sounds are normal. The liver and spleen are not enlarged. There Are no abdominal masses. There are no bruits.  Normal extremity without phlebitis or edema.Strength is normal and symmetrical in all extremities.  There is no lateralizing weakness.  There are no sensory deficits.  Patient does have severe memory disorder.The skin is warm and dry.  There is no rash.  EKG confirms atrial fibrillation with a controlled ventricular response     Assessment / Plan: Because of his significant hypotension we are cutting back on his furosemide to just half his present dose and we are stopping his lisinopril altogether.  We filled out the forms for the skilled nursing facility.  He will return here in 2 months or sooner when necessary

## 2011-12-11 NOTE — Patient Instructions (Signed)
Your physician recommends that you schedule a follow-up appointment in: 2 months office visit with Dr. Patty Sermons. EKG with this office visit. Your physician has recommended you make the following change in your medication: Stop Lisinopril medication, DECREASE LASIX TO 20 MG ONE TABLET BY MOUTH DAILY.

## 2011-12-11 NOTE — Assessment & Plan Note (Signed)
According to the records sent from the nursing home where he now resides, the patient has been on lisinopril and has been on furosemide 40 mg daily.  His blood pressure today is very low in the range of 70/30 obtained by the nurse and confirmed by me.  Because of his low blood pressure we are going to stop his lisinopril and reduce his furosemide to just 20 mg daily.  Despite the very low blood pressure the patient appears to be tolerating it well.

## 2011-12-26 ENCOUNTER — Ambulatory Visit: Payer: Medicare Other | Admitting: Urology

## 2012-01-04 ENCOUNTER — Other Ambulatory Visit: Payer: Medicare Other

## 2012-01-04 ENCOUNTER — Ambulatory Visit: Payer: Medicare Other | Admitting: Cardiology

## 2012-01-09 DIAGNOSIS — I509 Heart failure, unspecified: Secondary | ICD-10-CM

## 2012-01-10 DIAGNOSIS — I5023 Acute on chronic systolic (congestive) heart failure: Secondary | ICD-10-CM

## 2012-02-07 ENCOUNTER — Encounter: Payer: Medicare Other | Admitting: Cardiology

## 2012-02-23 ENCOUNTER — Ambulatory Visit: Payer: Medicare Other | Admitting: Cardiology

## 2012-04-03 ENCOUNTER — Ambulatory Visit (INDEPENDENT_AMBULATORY_CARE_PROVIDER_SITE_OTHER): Payer: Medicare Other | Admitting: Cardiology

## 2012-04-03 ENCOUNTER — Encounter: Payer: Self-pay | Admitting: Cardiology

## 2012-04-03 VITALS — BP 100/60 | HR 76 | Ht 71.0 in | Wt 160.0 lb

## 2012-04-03 DIAGNOSIS — I2510 Atherosclerotic heart disease of native coronary artery without angina pectoris: Secondary | ICD-10-CM

## 2012-04-03 DIAGNOSIS — R0602 Shortness of breath: Secondary | ICD-10-CM

## 2012-04-03 DIAGNOSIS — I4891 Unspecified atrial fibrillation: Secondary | ICD-10-CM

## 2012-04-03 DIAGNOSIS — F0390 Unspecified dementia without behavioral disturbance: Secondary | ICD-10-CM

## 2012-04-03 DIAGNOSIS — J449 Chronic obstructive pulmonary disease, unspecified: Secondary | ICD-10-CM

## 2012-04-03 NOTE — Patient Instructions (Addendum)
Your physician recommends that you continue on your current medications as directed. Please refer to the Current Medication list given to you today.  Your physician recommends that you schedule a follow-up appointment in: 4 months  

## 2012-04-03 NOTE — Assessment & Plan Note (Signed)
The patient is now at the [facility] for his dementia.  He is getting good care there.  The family states that he is eating well and enjoys the food.  He has lost 11 pounds since last visit

## 2012-04-03 NOTE — Assessment & Plan Note (Signed)
Patient has not had any recent chest pain or angina. 

## 2012-04-03 NOTE — Progress Notes (Signed)
[name] [name] Date of Birth:  [birth year] [facility] [address] [place], [place]  [address] [phone]         Fax   [phone]  History of Present Illness: This pleasant 78 year old gentleman is seen for a four-month followup office visit.  He has a history of ischemic heart disease.  He had coronary artery bypass graft surgery in 1996.  He had recurrent chest pain March 2012 and underwent stenting of the saphenous vein graft to the obtuse marginal.  He has been in atrial fibrillation since September 2012.  He is on aspirin and Plavix for his ischemic heart disease.  He is not on Coumadin because he is a fall risk.  He has a history of dementia diabetes and pulmonary fibrosis.  Current Outpatient Prescriptions  Medication Sig Dispense Refill  . albuterol (PROVENTIL) (2.5 MG/3ML) 0.083% nebulizer solution as directed.      [name] ALPRAZolam (XANAX) 0.25 MG tablet Take 0.25 mg by mouth 4 (four) times daily. As needed      . aspirin 81 MG tablet Take 81 mg by mouth daily.      [name] atorvastatin (LIPITOR) 80 MG tablet Take 10 mg by mouth daily.       . citalopram (CELEXA) 20 MG tablet Take 20 mg by mouth daily.      . furosemide (LASIX) 20 MG tablet Take 40 mg by mouth daily.      . isosorbide mononitrate (IMDUR) 30 MG 24 hr tablet Take 30 mg by mouth daily. Take one by mouth daily       . levothyroxine (SYNTHROID, LEVOTHROID) 137 MCG tablet Take 1 tablet (137 mcg total) by mouth daily.  90 tablet  3  . lisinopril (PRINIVIL,ZESTRIL) 5 MG tablet Take 5 mg by mouth daily.      . metFORMIN (GLUCOPHAGE) 500 MG tablet Take two by mouth twice daily       . metoprolol succinate (TOPROL-XL) 25 MG 24 hr tablet Take 25 mg by mouth daily.        . nitroGLYCERIN (NITROSTAT) 0.4 MG SL tablet Place 0.4 mg under the tongue every 5 (five) minutes as needed. For chest pain      . OXYGEN-HELIUM IN Inhale into the lungs. 3L via [place] continuous       . PLAVIX 75 MG tablet Take 75 mg by mouth  daily. Take one by mouth daily       . polyethylene glycol (MIRALAX / GLYCOLAX) packet Take 17 g by mouth daily.      [name] gabapentin (NEURONTIN) 300 MG capsule Take 300 mg by mouth daily.        [name] glipiZIDE (GLUCOTROL) 5 MG tablet Take 5 mg by mouth daily. Take one by mouth daily       . LORazepam (ATIVAN) 0.5 MG tablet Take 0.5 mg by mouth every 6 (six) hours as needed.      [name] SPIRIVA HANDIHALER 18 MCG inhalation capsule 18 mcg as directed.        Allergies  Allergen Reactions  . Amiodarone Rash  . Morphine And Related Itching and Rash    Patient Active Problem List  Diagnosis  . DYSPNEA  . Ischemic cardiomyopathy  . CAD (coronary artery disease)  . Systolic HF (heart failure)  . NSTEMI (non-ST elevated myocardial infarction)  . Status post coronary artery stent placement  . Anemia  . Diabetes mellitus  . Hypothyroidism  . Dementia  . PAF (paroxysmal atrial fibrillation)  .  Pyelonephritis    History  Smoking status  . Former Smoker  . Quit date: 09/11/1994  Smokeless tobacco  . Never Used    History  Alcohol Use No    No family history on file.  Review of Systems: Constitutional: no fever chills diaphoresis or fatigue or change in weight.  Head and neck: no hearing loss, no epistaxis, no photophobia or visual disturbance. Respiratory: No cough, shortness of breath or wheezing. Cardiovascular: No chest pain peripheral edema, palpitations. Gastrointestinal: No abdominal distention, no abdominal pain, no change in bowel habits hematochezia or melena. Genitourinary: No dysuria, no frequency, no urgency, no nocturia. Musculoskeletal:No arthralgias, no back pain, no gait disturbance or myalgias. Neurological: No dizziness, no headaches, no numbness, no seizures, no syncope, no weakness, no tremors. Hematologic: No lymphadenopathy, no easy bruising. Psychiatric: No confusion, no hallucinations, no sleep disturbance.    Physical Exam: Filed Vitals:   04/03/12 1006    BP: 100/60  Pulse: 76   General appearance reveals a demented gentleman in a wheelchair.  He has nasal oxygen in place.  Chest reveals mild inspiratory dry rales bilaterally.The precordium is quiet.  The first heart sound is normal.  The second heart sound is physiologically split.  There is no murmur gallop rub or click.  There is no abnormal lift or heave.  The abdomen is soft and nontender. Bowel sounds are normal. The liver and spleen are not enlarged. There Are no abdominal masses. There are no bruits.  The pedal pulses are good.  There is no phlebitis or edema.  There is no cyanosis or clubbing. Neurologic reveals severe dementiaThe skin is warm and dry.  There is no rash.  EKG shows atrial fibrillation with controlled ventricular response and nonspecific ST-T wave abnormalities and frequent PVCs  Assessment / Plan:  Continue same medication.  Recheck in 4 months

## 2012-04-03 NOTE — Assessment & Plan Note (Signed)
The patient has severe chronic dyspnea and is on oxygen 24 7.  Is not having any current purulent sputum fever or chills or evidence of any acute exacerbation of his pulmonary fibrosis

## 2012-06-26 NOTE — Addendum Note (Signed)
Addended by: Reine Just on: 06/26/2012 02:09 PM   Modules accepted: Orders

## 2012-07-23 ENCOUNTER — Ambulatory Visit: Payer: Medicare Other | Admitting: Cardiology

## 2012-08-12 ENCOUNTER — Ambulatory Visit: Payer: Medicare Other | Admitting: Cardiology

## 2012-10-30 DIAGNOSIS — I4891 Unspecified atrial fibrillation: Secondary | ICD-10-CM

## 2012-10-31 DIAGNOSIS — I4891 Unspecified atrial fibrillation: Secondary | ICD-10-CM

## 2012-11-03 DIAGNOSIS — I4891 Unspecified atrial fibrillation: Secondary | ICD-10-CM

## 2013-01-05 DIAGNOSIS — R4182 Altered mental status, unspecified: Secondary | ICD-10-CM

## 2013-02-04 ENCOUNTER — Encounter: Payer: Self-pay | Admitting: Cardiology

## 2013-02-04 DIAGNOSIS — I2589 Other forms of chronic ischemic heart disease: Secondary | ICD-10-CM

## 2013-02-04 DIAGNOSIS — I200 Unstable angina: Secondary | ICD-10-CM

## 2013-02-04 DIAGNOSIS — I5023 Acute on chronic systolic (congestive) heart failure: Secondary | ICD-10-CM

## 2013-12-30 ENCOUNTER — Telehealth: Payer: Self-pay

## 2013-12-30 NOTE — Telephone Encounter (Signed)
Patient past away per Iver Nestle

## 2014-01-09 DEATH — deceased
